# Patient Record
Sex: Male | Born: 1945 | Race: White | Hispanic: No | Marital: Married | Smoking: Former smoker
Health system: Southern US, Community
[De-identification: ages and names within clinical notes are randomized; demographics above are authoritative.]

## PROBLEM LIST (undated history)

## (undated) ENCOUNTER — Emergency Department (HOSPITAL_COMMUNITY): Payer: Medicare Other | Source: Home / Self Care

## (undated) DIAGNOSIS — J449 Chronic obstructive pulmonary disease, unspecified: Secondary | ICD-10-CM

## (undated) DIAGNOSIS — N289 Disorder of kidney and ureter, unspecified: Secondary | ICD-10-CM

## (undated) DIAGNOSIS — I2510 Atherosclerotic heart disease of native coronary artery without angina pectoris: Secondary | ICD-10-CM

## (undated) DIAGNOSIS — C679 Malignant neoplasm of bladder, unspecified: Secondary | ICD-10-CM

## (undated) DIAGNOSIS — I219 Acute myocardial infarction, unspecified: Secondary | ICD-10-CM

## (undated) HISTORY — DX: Malignant neoplasm of bladder, unspecified: C67.9

## (undated) HISTORY — PX: CORONARY STENT PLACEMENT: SHX1402

## (undated) HISTORY — DX: Atherosclerotic heart disease of native coronary artery without angina pectoris: I25.10

## (undated) HISTORY — DX: Disorder of kidney and ureter, unspecified: N28.9

## (undated) HISTORY — DX: Acute myocardial infarction, unspecified: I21.9

## (undated) HISTORY — DX: Chronic obstructive pulmonary disease, unspecified: J44.9

---

## 2008-05-31 ENCOUNTER — Encounter (INDEPENDENT_AMBULATORY_CARE_PROVIDER_SITE_OTHER): Payer: Self-pay | Admitting: Urology

## 2008-05-31 ENCOUNTER — Inpatient Hospital Stay (HOSPITAL_COMMUNITY): Admission: RE | Admit: 2008-05-31 | Discharge: 2008-06-03 | Payer: Self-pay | Admitting: Urology

## 2009-06-14 HISTORY — PX: KIDNEY SURGERY: SHX687

## 2009-08-02 LAB — PULMONARY FUNCTION TEST

## 2010-12-26 ENCOUNTER — Encounter: Payer: Self-pay | Admitting: Internal Medicine

## 2010-12-26 ENCOUNTER — Ambulatory Visit (INDEPENDENT_AMBULATORY_CARE_PROVIDER_SITE_OTHER): Payer: Medicare Other | Admitting: Internal Medicine

## 2010-12-26 VITALS — BP 128/82 | HR 92 | Ht 69.0 in | Wt 182.0 lb

## 2010-12-26 DIAGNOSIS — I2510 Atherosclerotic heart disease of native coronary artery without angina pectoris: Secondary | ICD-10-CM

## 2010-12-26 DIAGNOSIS — J449 Chronic obstructive pulmonary disease, unspecified: Secondary | ICD-10-CM

## 2010-12-26 NOTE — Patient Instructions (Addendum)
Call close hospitals, ask for the cardiopulmonary department, and ask if there is a pulmonary rehabilitation program  When the overnight oximetry result is available, please ask the DME  Company to send me a copy of the report   Try to hold your active bronchodilator meds to : Perfomist- twice daily Combivent- 2 puffs up to four times daily Albuterol by nebulizer only as an extra on bad days  Learn to recognize that trouble is coming- call early. Don't wait to see if you will get better on your own, because you might not.

## 2010-12-26 NOTE — Progress Notes (Signed)
  Subjective:    Patient ID: Gerald [name], male    DOB: October 30, 1945, 65 y.o.   MRN: [id number]  HPI 59 yoM with wife,. Former smoker followed by Dr Tanda Rockers, and coming today on referral from a hospitalist at [facility]. He was dc'd from [facility] 2 weeks ago after 3 day stay for acute exacerbation of COPD. Those summaries reviewed.  Current flare began a month ago. He denies routine cough or phlegm at baseline. DOE across parking lot. Still golfs with a cart. Long time smoker. . Sees Pulmonologist Dr Felipa Furnace in [place], but wanted to be seen here. Dx'd w/ COPD in 2010. FEV1 was 0.28%. He now feels he is back to baseline and says it is the best he has been in a year. An ONOX has been scheduled with a local DME company.  CXR 12/13/10- RLL infiltrate suspicious for atelectasis/ pneumonitis with f/u recommended. COPD, probable scarring.  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 Her has been using his inhaler very freuently and we discussed guidelines and alernatives.  Review of Systems See HPI Constitutional:   No weight loss, night sweats,  Fevers, chills, fatigue, lassitude. HEENT:   No headaches,  Difficulty swallowing,  Tooth/dental problems,  Sore throat,                No sneezing, itching, ear ache, nasal congestion, post nasal drip,   CV:  No chest pain,  Orthopnea, PND, swelling in lower extremities, anasarca, dizziness, palpitations  GI  No heartburn, indigestion, abdominal pain, nausea, vomiting, diarrhea, change in bowel habits, loss of appetite  Resp:.  No excess mucus, no productive cough,  No coughing up of blood.  No change in color of mucus.  No wheezing. Skin: no rash or lesions.  GU: no dysuria, change in color of urine, no urgency or frequency.  No flank pain.  MS:  No joint pain or swelling.  No decreased range of motion.  Psych:  No change in mood or affect. No depression or anxiety.  No memory loss.      Objective:   Physical Exam General- Alert, Oriented, Affect-appropriate, Distress- none acute. Talkative  Skin- rash-none, lesions- none, excoriation- none  Lymphadenopathy- none  Head- atraumatic  Eyes- Gross vision intact, PERRLA, conjunctivae clear, secretions  Ears- Hearing  Normal  canals, Tm L ,   R ,  Nose- Clear, No-Septal dev, mucus, polyps, erosion, perforation   Throat- Mallampati II , mucosa clear , drainage- none, tonsils- atrophic  Neck- flexible , trachea midline, no stridor , thyroid nl, carotid no bruit  Chest - symmetrical excursion , unlabored     Heart/CV- RRR , no murmur , no gallop  , no rub, nl s1 s2                     - JVD- none , edema- none, stasis changes- none, varices- none     Lung-very distant No- wheeze- none, cough- none , dullness-none, rub- none     Chest wall-  Abd- tender-no, distended-no, bowel sounds-present, HSM- no  Br/ Gen/ Rectal- Not done, not indicated  Extrem- cyanosis- none, clubbing, none, atrophy- none, strength- nl  Neuro- grossly intact to observation         Assessment & Plan:

## 2010-12-30 ENCOUNTER — Encounter: Payer: Self-pay | Admitting: Internal Medicine

## 2010-12-30 DIAGNOSIS — J441 Chronic obstructive pulmonary disease with (acute) exacerbation: Secondary | ICD-10-CM | POA: Insufficient documentation

## 2010-12-30 DIAGNOSIS — I2510 Atherosclerotic heart disease of native coronary artery without angina pectoris: Secondary | ICD-10-CM | POA: Insufficient documentation

## 2010-12-30 NOTE — Assessment & Plan Note (Addendum)
Severe COPD, mainly primary emphysema, which is why he has not felt the burden of frequent cough and phlegm.  I have asked him to reduce his metered inhaler use to no more that 2 puffs, 4 x daily.  We discussed pulmonary rehab and he will try to find a program close to his home. We discussed medication options. His long drive here is an issue and I've recommended he go back to his usual physicians.

## 2010-12-31 ENCOUNTER — Telehealth: Payer: Self-pay | Admitting: Internal Medicine

## 2010-12-31 NOTE — Telephone Encounter (Signed)
I will address this matter with [name] in the morning.

## 2011-01-05 NOTE — Telephone Encounter (Signed)
Gerald [name], was this addressed with [name]?

## 2011-01-06 NOTE — Telephone Encounter (Signed)
I have left a message for patient to call me back regarding this matter-pt had test done in [place] but I need to know where and when so I can get the results other wise the pt will need to come to our office to have PFT done so he can get in pulm rehab.

## 2011-01-06 NOTE — Telephone Encounter (Signed)
I spoke with patient-he states that he had his PFT at [facility] in [place] approx in 2010; I called and spoke with Margaretha Glassing in Medical records and had to fax letter head request to her; once she gets this she will fax them to me to let [name] review and send order and fax results to [facility] pulm rehab.

## 2011-01-07 ENCOUNTER — Other Ambulatory Visit: Payer: Self-pay | Admitting: Internal Medicine

## 2011-01-07 ENCOUNTER — Encounter: Payer: Self-pay | Admitting: Internal Medicine

## 2011-01-07 DIAGNOSIS — J449 Chronic obstructive pulmonary disease, unspecified: Secondary | ICD-10-CM

## 2011-01-07 NOTE — Telephone Encounter (Signed)
Bjorn Loser has completed this order and Pulm Rehab will contact patient.

## 2011-01-07 NOTE — Telephone Encounter (Signed)
PFT results were faxed and placed on CDYs cart to review and agree to send to [facility].

## 2011-01-15 ENCOUNTER — Encounter (HOSPITAL_COMMUNITY)
Admission: RE | Admit: 2011-01-15 | Discharge: 2011-01-15 | Disposition: A | Discharge status: Home / Self Care | Payer: Medicare Other | Source: Ambulatory Visit | Attending: Internal Medicine | Admitting: Internal Medicine

## 2011-01-15 DIAGNOSIS — J4489 Other specified chronic obstructive pulmonary disease: Secondary | ICD-10-CM | POA: Insufficient documentation

## 2011-01-15 DIAGNOSIS — J449 Chronic obstructive pulmonary disease, unspecified: Secondary | ICD-10-CM | POA: Insufficient documentation

## 2011-01-15 DIAGNOSIS — Z5189 Encounter for other specified aftercare: Secondary | ICD-10-CM | POA: Insufficient documentation

## 2011-01-19 ENCOUNTER — Encounter (HOSPITAL_COMMUNITY): Payer: Medicare Other

## 2011-01-19 ENCOUNTER — Encounter: Payer: Self-pay | Admitting: Internal Medicine

## 2011-01-21 ENCOUNTER — Encounter (HOSPITAL_COMMUNITY): Payer: Medicare Other

## 2011-01-26 ENCOUNTER — Encounter (HOSPITAL_COMMUNITY): Payer: Medicare Other

## 2011-01-27 NOTE — Discharge Summary (Signed)
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   HISTORY AND PHYSICAL:  For full details, please see admission history  and physical.  Briefly, Gerald Gerald [name] is a 65 year old gentleman who  initially presented with gross hematuria and underwent an evaluation by  Dr. Ian Bushman in [place], [place].  He was subsequently diagnosed with  a renal pelvic tumor of the right kidney.  His brushing and cytology was  consistent with a urothelial carcinoma and did appear to be low-grade.  After discussion regarding management options for treatment, he elected  to proceed with a right laparoscopic nephroureterectomy.  He did undergo  a metastatic evaluation which was negative.   HOSPITAL COURSE:  On May 31, 2008, the patient was taken to the  operating room and underwent a right laparoscopic nephroureterectomy.  He tolerated this procedure well without complications.  Postoperatively, he was able to be transferred to a regular hospital  room following recovery from anesthesia.  He was able to begin  ambulating the night of surgery and remained hemodynamically stable.  He  was kept on telemetry due to his history of coronary artery disease.  On  postoperative day #1, his hematocrit remained stable at 39.8.  In  addition, his renal function remained stable with a creatinine of 1.2.  He did begin passing flatus on postoperative day #1, and his diet was  gradually advanced throughout the day until he was able tolerate a  regular diet on the  afternoon of postoperative day #1.  In addition, he  maintained excellent urine output with minimal output from his pelvic  drain.  His pelvic drain fluid was sent for a creatinine level and  returned at 1.3, consistent with his serum creatinine.  Therefore, his  drain was able to be removed on postoperative day #2.  On postoperative  day #2, he was reevaluated and was having increased abdominal pain and  decreased appetite.  He was therefore kept for observation and his  appetite improved throughout the afternoon of postoperative day #2.  He  was administered a suppository and did pass more flatus and had relief  of his abdominal discomfort.  On the morning of postoperative day #3, he  was reevaluated and was much improved.  He was therefore felt stable for  discharge home with his Foley catheter.   DISPOSITION:  Home.   DISCHARGE MEDICATIONS:  He was instructed to resume his regular home  medications, except any aspirin, nonsteroidal anti-inflammatory drugs,  or herbal supplements.  He was given a prescription to take Vicodin as  needed for pain and to use Colace as a  stool softener.  He was also  given a prescription to begin Cipro 1 day prior to his return visit for  Foley catheter removal next week at which time he will undergo a  cystogram.   DISCHARGE INSTRUCTIONS:  He was instructed to be ambulatory, but  specifically told to refrain from any heavy lifting, strenuous activity,  or driving.  He was instructed on routine Foley catheter care.   FOLLOW UP:  He will follow up next week for a cystogram and to discuss  his surgical pathology results which are pending at the time of his  discharge.      Heloise Purpura, MD  Electronically Signed     LB/MEDQ  D:  06/03/2008  T:  06/05/2008  Job:  [id number]

## 2011-01-27 NOTE — Op Note (Signed)
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   PROCEDURE:  Right laparoscopic nephroureterectomy.   SURGEON:  Heloise Purpura, MD   ASSISTANT:  Delia Chimes, NP and Dr. Delman Kitten.   ANESTHESIA:  General.   COMPLICATIONS:  None.   ESTIMATED BLOOD LOSS:  100 mL.   INTRAVENOUS FLUIDS:  2950 of crystalloid.   SPECIMENS:  Right kidney, ureter and bladder cuff.   DISPOSITION OF SPECIMENS:  To pathology.   DRAINS:  Number 15 Jackson-Pratt drain.   INDICATIONS:  Mr. Dineen is a 65 year old gentleman who presented with  gross hematuria and was subsequently found to have a filling defect in  his right renal pelvis and did undergo a brushing and cytology  consistent with a low-grade papillary urothelial carcinoma.  He  underwent a metastatic evaluation which was negative and the remainder  of his urinary tract appeared to be free of any tumors.  After a  discussion regarding management options for treatment, he elected to  proceed with surgical therapy and the above procedure.  Potential risks,  complications, and alternative treatment options associated with this  procedure were discussed with the patient in detail and informed consent  was obtained.   DESCRIPTION OF PROCEDURE:  The patient was taken to the operating room  and a general anesthetic was administered.  He was given preoperative  antibiotics, placed in the right modified flank position and prepped and  draped in the usual sterile fashion.  Next a preoperative time-out was  performed.  A site was selected just superior to the umbilicus for  placement of the camera port.  This was placed using a standard open  Hasson technique which allowed entry into the peritoneal cavity under  direct vision and without difficulty.  Zero Vicryl holding stitches were  placed in the rectus fascia and a 10 mm Hasson cannula was placed and  secured with these stitches.  The pneumoperitoneum was established and a  30 degree lens was used to inspect the abdomen.  The patient was noted  to have a very dilated colon but otherwise no evidence of any intra-  abdominal injuries or other abnormalities. Additional ports were then  placed under direct vision including a 5 mm port in the right upper  quadrant and laterally and a 12 mm port in the right lower quadrant.  The white line of Toldt was incised along the length of the descending  colon allowing the colon to be mobilized medially and thereby exposing  the retroperitoneum and kidney.  The space between the mesocolon and  anterior layer of Gerota's fascia was developed.  The  ureter was  identified and lifted anteriorly off the psoas muscle.  Dissection then  proceeded superiorly allowing identification of the renal hilar vessels.  A solitary renal artery and vein were identified and were isolated from  the surrounding tissues with the aid of the Harmonic scalpel.  The renal  artery was isolated with right-angle dissection and divided between  multiple 10 mm Hem-o-lok clips.  The renal vein was then isolated and  similarly divided after ligation with four separate 15 mm Hem-o-lok  clips.  Gerota's fascia was entered intentionally superiorly, thereby  sparing the adrenal gland and the attachments between the kidney and the  liver were carefully taken down with the Harmonic scalpel along with the  attachments at the lateral sidewall.  The ureter was then traced  inferiorly down the psoas muscle.  The gonadal vein was identified where   it crossed the renal vein and was divided between multiple Hem-o-lok  clips.  The ureter was then isolated down to a point past the common  iliac artery.  At this point, the table was placed into the  Trendelenburg position and an additional 12 mm port was placed in the  lower midline of the abdomen.  The ureter was then traced down to the  bladder laparoscopically.  The superior vesical artery was identified  and was divided between multiple Hem-o-lok clips.  Once the ureter was  freed all the way down to the bladder the bladder was filled and the  bladder was reflected posteriorly allowing entry into space of Retzius  and thereby exposing the right lateral aspect of the bladder down to the  ureter.  At this point the 12 mm port that had been initially placed in  the right lower quadrant and the camera port were closed with zero  Vicryl sutures placed with the aid of a suture passer device.  The  remaining ports were then removed under direct vision.  The lower  midline port site was then extended in the lower midline to allow  removal of the kidney.  A self-retaining Bookwalter retractor was placed  and the ureter was freed up with a cuff of bladder down to the detrusor  muscle.  Once adequate bladder cuff had been developed this was clamped  with a right-angle clamp.  A 3-0 Vicryl pursestring suture was placed  widely around this area and the ureter was clamped and divided distally  and sent off for permanent pathologic analysis.  The clamp portion of  the bladder was then inverted and oversewn with a running 3-0 Vicryl  suture.  The previously placed 3-0 Vicryl pursestring was then closed  down for an additional two-layer closure.  The bladder was then filled  with sterile saline and there was no evidence of a leak.  Hemostasis  appeared excellent and irrigation of the pelvis was performed.  The  fascial opening in the lower midline was then closed with two running #1  PDS sutures  after a #15 Jackson-Pratt drain was placed in the pelvis.  It was secured to the skin with a nylon suture.  All ports and incision  sites were then injected with quarter percent Marcaine and  reapproximated at the skin level with staples.  Sterile dressings were  applied.  The patient appeared to tolerate the procedure well without  complications.  He was able to be extubated and transferred to the  recovery unit in satisfactory condition.      Heloise Purpura, MD  Electronically Signed  LB/MEDQ  D:  05/31/2008  T:  06/03/2008  Job:  [id number]

## 2011-01-28 ENCOUNTER — Encounter (HOSPITAL_COMMUNITY): Payer: Medicare Other

## 2011-02-02 ENCOUNTER — Encounter (HOSPITAL_COMMUNITY): Payer: Medicare Other

## 2011-02-04 ENCOUNTER — Encounter (HOSPITAL_COMMUNITY): Payer: Medicare Other

## 2011-02-05 ENCOUNTER — Ambulatory Visit (INDEPENDENT_AMBULATORY_CARE_PROVIDER_SITE_OTHER): Payer: Medicare Other | Admitting: Internal Medicine

## 2011-02-05 ENCOUNTER — Encounter: Payer: Self-pay | Admitting: Internal Medicine

## 2011-02-05 ENCOUNTER — Telehealth: Payer: Self-pay | Admitting: Internal Medicine

## 2011-02-05 VITALS — BP 122/70 | HR 72 | Ht 69.0 in | Wt 181.2 lb

## 2011-02-05 DIAGNOSIS — J449 Chronic obstructive pulmonary disease, unspecified: Secondary | ICD-10-CM

## 2011-02-05 DIAGNOSIS — I2510 Atherosclerotic heart disease of native coronary artery without angina pectoris: Secondary | ICD-10-CM

## 2011-02-05 NOTE — Telephone Encounter (Signed)
Patient called back wanted to make sure Katie had the message to fax his Pulmonary Rehab results to him.Vedia Coffer

## 2011-02-05 NOTE — Telephone Encounter (Signed)
Will forward this to [facility]

## 2011-02-05 NOTE — Progress Notes (Signed)
Subjective:    Patient ID: Gerald [name], male    DOB: [birth year], 65 y.o.   MRN: [id number]  HPI    Review of Systems     Objective:   Physical Exam        Assessment & Plan:   Subjective:    Patient ID: Gerald [name], male    DOB: 03/07/1946, 66 y.o.   MRN: [id number]  HPI 12/26/10- 57 yoM with wife,. Former smoker followed by Dr Tanda Rockers, and coming today on referral from a hospitalist at [facility]. He was dc'd from [facility] 2 weeks ago after 3 day stay for acute exacerbation of COPD. Those summaries reviewed.  Current flare began a month ago. He denies routine cough or phlegm at baseline. DOE across parking lot. Still golfs with a cart. Long time smoker. . Sees Pulmonologist Dr Felipa Furnace in [place], but wanted to be seen here. Dx'd w/ COPD in 2010. FEV1 was 0.28%. He now feels he is back to baseline and says it is the best he has been in a year. An ONOX has been scheduled with a local DME company.  CXR 12/13/10- RLL infiltrate suspicious for atelectasis/ pneumonitis with f/u recommended. COPD, probable scarring.  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 Her has been using his inhaler very freuently and we discussed guidelines and alernatives.  02/05/11- COPD, CAD, wife here Since last here has started pulmonary rehab at [facility] and he feels it is helping. Noticing trace ankle edema in recent days.  Feeling a little more bloated and BP goes up with exercise. Continues sleeping with O2 at 2l [facility]. Nebulizer helps clear morning mucus.    Review of Systems See HPI Constitutional:   No weight loss, night sweats,  Fevers, chills, fatigue, lassitude. HEENT:   No headaches,  Difficulty swallowing,  Tooth/dental problems,  Sore throat,                No sneezing, itching, ear ache, nasal congestion, post nasal drip,   CV:  No chest pain,  Orthopnea, PND, , anasarca, dizziness, palpitations  GI  No heartburn,  indigestion, abdominal pain, nausea, vomiting, diarrhea, change in bowel habits, loss of appetite  Resp:.  No excess mucus, no productive cough,  No coughing up of blood.  No change in color of mucus.  No wheezing. Skin: no rash or lesions.  GU: no dysuria, change in color of urine, no urgency or frequency.  No flank pain.  MS:  No joint pain or swelling.  No decreased range of motion.  Psych:  No change in mood or affect. No depression or anxiety.  No memory loss.      Objective:   Physical Exam General- Alert, Oriented, Affect-appropriate, Distress- none acute. Talkative  Skin- rash-none, lesions- none, excoriation- none  Lymphadenopathy- none  Head- atraumatic  Eyes- Gross vision intact, PERRLA, conjunctivae clear secretions  Ears- Hearing  Normal  canals, Tm-  normal,  Nose- Clear, No-Septal dev, mucus, polyps, erosion, perforation   Throat- Mallampati II , mucosa clear , drainage- none, tonsils- atrophic  Neck- flexible , trachea midline, no stridor , thyroid nl, carotid no bruit  Chest - symmetrical excursion , unlabored     Heart/CV- RRR , no murmur , no gallop  , no rub, nl s1 s2                     - JVD- none , edema-trace, stasis changes-  none, varices- none     Lung-very distant        wheeze- expiratory,             cough- none , dullness-none, rub- none     Chest wall-  Abd- tender-no, distended-no, bowel sounds-present, HSM- no  Br/ Gen/ Rectal- Not done, not indicated  Extrem- cyanosis- none, clubbing, none, atrophy- none, strength- nl  Neuro- grossly intact to observation         Assessment & Plan:

## 2011-02-05 NOTE — Patient Instructions (Signed)
We discussed travel needs- it never hurts to discuss this with airlines, cruise ship companies and hotels ahead of time.

## 2011-02-05 NOTE — Assessment & Plan Note (Signed)
Pulmonary rehab is helping  We discussed how to pace himself No need to change meds.

## 2011-02-09 ENCOUNTER — Encounter (HOSPITAL_COMMUNITY): Payer: Medicare Other

## 2011-02-09 NOTE — Assessment & Plan Note (Signed)
Discussed overlap of cardiac and pulmonary dyspnea considerations.

## 2011-02-11 ENCOUNTER — Encounter (HOSPITAL_COMMUNITY): Payer: Medicare Other

## 2011-02-13 ENCOUNTER — Encounter: Payer: Self-pay | Admitting: Internal Medicine

## 2011-02-16 ENCOUNTER — Encounter (HOSPITAL_COMMUNITY)
Admission: RE | Admit: 2011-02-16 | Discharge: 2011-02-16 | Disposition: A | Discharge status: Home / Self Care | Payer: Medicare Other | Source: Ambulatory Visit | Attending: Internal Medicine | Admitting: Internal Medicine

## 2011-02-16 DIAGNOSIS — J449 Chronic obstructive pulmonary disease, unspecified: Secondary | ICD-10-CM | POA: Insufficient documentation

## 2011-02-16 DIAGNOSIS — Z5189 Encounter for other specified aftercare: Secondary | ICD-10-CM | POA: Insufficient documentation

## 2011-02-16 DIAGNOSIS — J4489 Other specified chronic obstructive pulmonary disease: Secondary | ICD-10-CM | POA: Insufficient documentation

## 2011-02-18 ENCOUNTER — Encounter (HOSPITAL_COMMUNITY): Payer: Medicare Other

## 2011-02-23 ENCOUNTER — Encounter (HOSPITAL_COMMUNITY): Payer: Medicare Other

## 2011-02-25 ENCOUNTER — Encounter (HOSPITAL_COMMUNITY): Payer: Medicare Other

## 2011-03-02 ENCOUNTER — Encounter (HOSPITAL_COMMUNITY): Payer: Medicare Other

## 2011-03-04 ENCOUNTER — Encounter (HOSPITAL_COMMUNITY): Payer: Medicare Other

## 2011-03-09 ENCOUNTER — Encounter (HOSPITAL_COMMUNITY): Admission: RE | Admit: 2011-03-09 | Payer: Medicare Other | Source: Ambulatory Visit

## 2011-03-11 ENCOUNTER — Encounter (HOSPITAL_COMMUNITY): Admission: RE | Admit: 2011-03-11 | Payer: Medicare Other | Source: Ambulatory Visit

## 2011-03-16 ENCOUNTER — Encounter (HOSPITAL_COMMUNITY): Payer: Medicare Other

## 2011-03-16 DIAGNOSIS — J449 Chronic obstructive pulmonary disease, unspecified: Secondary | ICD-10-CM | POA: Insufficient documentation

## 2011-03-16 DIAGNOSIS — J4489 Other specified chronic obstructive pulmonary disease: Secondary | ICD-10-CM | POA: Insufficient documentation

## 2011-03-16 DIAGNOSIS — Z5189 Encounter for other specified aftercare: Secondary | ICD-10-CM | POA: Insufficient documentation

## 2011-03-18 ENCOUNTER — Encounter (HOSPITAL_COMMUNITY): Payer: Medicare Other

## 2011-03-23 ENCOUNTER — Encounter (HOSPITAL_COMMUNITY): Payer: Medicare Other

## 2011-03-25 ENCOUNTER — Encounter (HOSPITAL_COMMUNITY): Payer: Medicare Other

## 2011-03-30 ENCOUNTER — Encounter (HOSPITAL_COMMUNITY)
Admission: RE | Admit: 2011-03-30 | Discharge: 2011-03-30 | Disposition: A | Discharge status: Home / Self Care | Payer: Medicare Other | Source: Ambulatory Visit | Attending: Internal Medicine | Admitting: Internal Medicine

## 2011-04-01 ENCOUNTER — Encounter (HOSPITAL_COMMUNITY)
Admission: RE | Admit: 2011-04-01 | Discharge: 2011-04-01 | Disposition: A | Discharge status: Home / Self Care | Payer: Medicare Other | Source: Ambulatory Visit | Attending: Internal Medicine | Admitting: Internal Medicine

## 2011-04-06 ENCOUNTER — Encounter (HOSPITAL_COMMUNITY)
Admission: RE | Admit: 2011-04-06 | Discharge: 2011-04-06 | Disposition: A | Discharge status: Home / Self Care | Payer: Medicare Other | Source: Ambulatory Visit | Attending: Internal Medicine | Admitting: Internal Medicine

## 2011-04-08 ENCOUNTER — Encounter (HOSPITAL_COMMUNITY)
Admission: RE | Admit: 2011-04-08 | Discharge: 2011-04-08 | Disposition: A | Discharge status: Home / Self Care | Payer: Medicare Other | Source: Ambulatory Visit | Attending: Internal Medicine | Admitting: Internal Medicine

## 2011-04-13 ENCOUNTER — Encounter (HOSPITAL_COMMUNITY)
Admission: RE | Admit: 2011-04-13 | Discharge: 2011-04-13 | Disposition: A | Discharge status: Home / Self Care | Payer: Medicare Other | Source: Ambulatory Visit | Attending: Internal Medicine | Admitting: Internal Medicine

## 2011-04-15 ENCOUNTER — Encounter (HOSPITAL_COMMUNITY)
Admission: RE | Admit: 2011-04-15 | Discharge: 2011-04-15 | Disposition: A | Discharge status: Home / Self Care | Payer: Medicare Other | Source: Ambulatory Visit | Attending: Internal Medicine | Admitting: Internal Medicine

## 2011-04-15 DIAGNOSIS — Z5189 Encounter for other specified aftercare: Secondary | ICD-10-CM | POA: Insufficient documentation

## 2011-04-15 DIAGNOSIS — J4489 Other specified chronic obstructive pulmonary disease: Secondary | ICD-10-CM | POA: Insufficient documentation

## 2011-04-15 DIAGNOSIS — J449 Chronic obstructive pulmonary disease, unspecified: Secondary | ICD-10-CM | POA: Insufficient documentation

## 2011-04-20 ENCOUNTER — Encounter (HOSPITAL_COMMUNITY): Payer: Medicare Other

## 2011-04-22 ENCOUNTER — Encounter (HOSPITAL_COMMUNITY)
Admission: RE | Admit: 2011-04-22 | Discharge: 2011-04-22 | Disposition: A | Discharge status: Home / Self Care | Payer: Medicare Other | Source: Ambulatory Visit | Attending: Internal Medicine | Admitting: Internal Medicine

## 2011-04-27 ENCOUNTER — Encounter (HOSPITAL_COMMUNITY)
Admission: RE | Admit: 2011-04-27 | Discharge: 2011-04-27 | Disposition: A | Discharge status: Home / Self Care | Payer: Medicare Other | Source: Ambulatory Visit | Attending: Internal Medicine | Admitting: Internal Medicine

## 2011-04-29 ENCOUNTER — Encounter (HOSPITAL_COMMUNITY)
Admission: RE | Admit: 2011-04-29 | Discharge: 2011-04-29 | Disposition: A | Discharge status: Home / Self Care | Payer: Medicare Other | Source: Ambulatory Visit | Attending: Internal Medicine | Admitting: Internal Medicine

## 2011-05-04 ENCOUNTER — Encounter (HOSPITAL_COMMUNITY): Payer: Medicare Other

## 2011-05-04 NOTE — Progress Notes (Signed)
Cardiac Rehab Progress Report  Orientation:01/25/11 Graduate Date:  tbd Discharge Date:  tbd  Pulmonologist:Clinton Young Family MD:  Dr. Tanda Rockers Class Time:  13:00  A.  Exercise Program:  Tolerates exercise @ 2.1 METS for 15 minutes  B.  Mental Health:  Good mental attitude  C.  Education/Instruction/Skills  Knows THR for exercise, Uses Perceived Exertion Scale and/or Dyspnea Scale and Attended all education classes  D.  Nutrition/Weight Control/Body Composition:  Adherence to prescribed nutrition program: good   E.  Blood Lipids    No results found for this basename: CHOL     No results found for this basename: TRIG     No results found for this basename: HDL     No results found for this basename: CHOLHDL     No results found for this basename: LDLDIRECT      F.  Lifestyle Changes:  Making positive lifestyle changes  G.  Symptoms noted with exercise:  Asymptomatic  Report Completed By:  Angelica Pou  Comments:  This is patient's 1'st week report  He did very well for his 3 day . He achieved a peak met's of 2.1, and continues to improve

## 2011-05-06 ENCOUNTER — Encounter (HOSPITAL_COMMUNITY): Payer: Medicare Other

## 2011-05-11 ENCOUNTER — Encounter (HOSPITAL_COMMUNITY): Payer: Medicare Other

## 2011-05-13 ENCOUNTER — Encounter (HOSPITAL_COMMUNITY): Payer: Medicare Other

## 2011-05-18 ENCOUNTER — Encounter (HOSPITAL_COMMUNITY): Payer: Medicare Other

## 2011-05-20 ENCOUNTER — Encounter (HOSPITAL_COMMUNITY): Payer: Medicare Other

## 2011-05-25 ENCOUNTER — Encounter (HOSPITAL_COMMUNITY): Payer: Medicare Other

## 2011-05-27 ENCOUNTER — Encounter (HOSPITAL_COMMUNITY): Payer: Medicare Other

## 2011-05-29 NOTE — Progress Notes (Signed)
Cardiac Rehab Progress Report  Orientation: 01/14/2009 Graduate Date: tbd Discharge Date:  tbd Pulmonologist: Jetty Duhamel Family MD:  Dr. Tanda Rockers Class Time:  13:00  A.  Exercise Program:  Tolerates exercise @ 2.2 METS for 15 minutes  B.  Mental Health:  Good mental attitude  C.  Education/Instruction/Skills  Knows THR for exercise  D.  Nutrition/Weight Control/Body Composition:  Adherence to prescribed nutrition program: good   E.  Blood Lipids    No results found for this basename: CHOL     No results found for this basename: TRIG     No results found for this basename: HDL     No results found for this basename: CHOLHDL     No results found for this basename: LDLDIRECT      F.  Lifestyle Changes:  Making positive lifestyle changes  G.  Symptoms noted with exercise:  Asymptomatic  Report Completed By:  Angelica Pou  Comments:  Patient is doing well in pulmonary rehab He has achieved a peak mets of 2.2 and has a peak O2 of 93 He has reached his half way  point

## 2011-05-29 NOTE — Progress Notes (Signed)
Cardiac Rehab Progress Report  Orientation:  01/15/2011 Graduate Date:  04/29/2011 Discharge Date:  04/29/2011  Pulmonologist; Joni Fears young Family MD:  Dr. Tanda Rockers Class Time:  13:00  A.  Exercise Program:  Tolerates exercise @2 .3 METS for 15 minutes and Discharged to home exercise program.  Anticipated compliance:  excellent  B.  Mental Health:  Good mental attitude  C.  Education/Instruction/Skills  Knows THR for exercise, Uses Perceived Exertion Scale and/or Dyspnea Scale and Attended all education classes  D.  Nutrition/Weight Control/Body Composition:  Adherence to prescribed nutrition program: good   E.  Blood Lipids    No results found for this basename: CHOL     No results found for this basename: TRIG     No results found for this basename: HDL     No results found for this basename: CHOLHDL     No results found for this basename: LDLDIRECT      F.  Lifestyle Changes:  Making positive lifestyle changes  G.  Symptoms noted with exercise:  Asymptomatic  Report Completed By:  Angelica Pou  Comments: Patient is discharged today after 24 sessions he achieved a peak mets of 2.3 and a peak O2 of 96. He is very motivated to continue to exercise. Cardiac rehab staff will continue to monitor his progress at 1 month, 6 months,and 1 year to assure compliance.

## 2011-06-01 ENCOUNTER — Encounter (HOSPITAL_COMMUNITY): Payer: Medicare Other

## 2011-06-03 ENCOUNTER — Encounter (HOSPITAL_COMMUNITY): Payer: Medicare Other

## 2011-06-08 ENCOUNTER — Encounter (HOSPITAL_COMMUNITY): Payer: Medicare Other

## 2011-06-10 ENCOUNTER — Encounter (HOSPITAL_COMMUNITY): Payer: Medicare Other

## 2011-06-11 ENCOUNTER — Ambulatory Visit: Payer: Medicare Other | Admitting: Internal Medicine

## 2011-06-12 ENCOUNTER — Telehealth: Payer: Self-pay | Admitting: Internal Medicine

## 2011-06-12 NOTE — Telephone Encounter (Signed)
Please note that pt had originally been scheduled to see dr young on 9/27 but was "bumped" as dr young changed his schedule. Gerald [name]

## 2011-06-12 NOTE — Telephone Encounter (Signed)
Spoke with spouse and advised he has no openings that am and should keep appt at 3:30. She verbalized understanding and will keep the appt

## 2011-06-15 ENCOUNTER — Encounter: Payer: Self-pay | Admitting: Internal Medicine

## 2011-06-15 ENCOUNTER — Ambulatory Visit (INDEPENDENT_AMBULATORY_CARE_PROVIDER_SITE_OTHER): Payer: Medicare Other | Admitting: Internal Medicine

## 2011-06-15 VITALS — BP 108/78 | HR 95 | Ht 69.0 in | Wt 180.8 lb

## 2011-06-15 DIAGNOSIS — J449 Chronic obstructive pulmonary disease, unspecified: Secondary | ICD-10-CM

## 2011-06-15 DIAGNOSIS — Z23 Encounter for immunization: Secondary | ICD-10-CM

## 2011-06-15 LAB — BASIC METABOLIC PANEL
BUN: 12
CO2: 28
CO2: 30
Calcium: 8.1 — ABNORMAL LOW
Chloride: 101
Chloride: 99
GFR calc Af Amer: >60
GFR calc non Af Amer: >60
Glucose, Bld: 153 — ABNORMAL HIGH
Glucose, Bld: 183 — ABNORMAL HIGH
Potassium: 4.1
Potassium: 4.6
Sodium: 132 — ABNORMAL LOW
Sodium: 138

## 2011-06-15 LAB — CREATININE, FLUID (PLEURAL, PERITONEAL, JP DRAINAGE): Creat, Fluid: 1.3

## 2011-06-15 LAB — TYPE AND SCREEN

## 2011-06-15 LAB — HEMOGLOBIN AND HEMATOCRIT, BLOOD: HCT: 39.8

## 2011-06-15 MED ORDER — AZITHROMYCIN 250 MG PO TABS: ORAL_TABLET | ORAL | Status: AC

## 2011-06-15 MED ORDER — METHYLPREDNISOLONE ACETATE 80 MG/ML IJ SUSP
80.0000 mg | Freq: Once | INTRAMUSCULAR | Status: AC
  Administered 2011-06-15: 80 mg via INTRAMUSCULAR

## 2011-06-15 MED ORDER — ROFLUMILAST 500 MCG PO TABS
500.0000 ug | ORAL_TABLET | Freq: Every day | ORAL | Status: DC
Start: 2011-06-15 — End: 2011-10-02

## 2011-06-15 NOTE — Patient Instructions (Signed)
Depo 80  Flu shot  Stop Spiriva- ok to use on a bad day for shortness of breath  Script for Daliresp - start with 1/2 pill every day or every other day, after a meal, for a week or two. If GI is ok, then try taking a whole tab daily.

## 2011-06-15 NOTE — Progress Notes (Signed)
Patient ID: Gerald [name], male    DOB: 02-Nov-1945, 65 y.o.   MRN: [id number]  HPI 12/26/10- 66 yoM with wife,. Former smoker followed by Dr Tanda Rockers, and coming today on referral from a hospitalist at [facility]. He was dc'd from [place] 2 weeks ago after 3 day stay for acute exacerbation of COPD. Those summaries reviewed.  Current flare began a month ago. He denies routine cough or phlegm at baseline. DOE across parking lot. Still golfs with a cart. Long time smoker. . Sees Pulmonologist Dr Felipa Furnace in [place], but wanted to be seen here. Dx'd w/ COPD in 2010. FEV1 was 0.28%. He now feels he is back to baseline and says it is the best he has been in a year. An ONOX has been scheduled with a local DME company.  CXR 12/13/10- RLL infiltrate suspicious for atelectasis/ pneumonitis with f/u recommended. COPD, probable scarring.  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 Her has been using his inhaler very freuently and we discussed guidelines and alernatives.  02/05/11- COPD, CAD, wife here Since last here has started pulmonary rehab at [facility] and he feels it is helping. Noticing trace ankle edema in recent days.  Feeling a little more bloated and BP goes up with exercise. Continues sleeping with O2 at 2 L/ [facility]. Nebulizer helps clear morning mucus.   06/15/11- 65 year old male former smoker followed for COPD, CAD, .............[name] wife here He completed pulmonary rehabilitation program at [facility] and says it really didn't help him. He is trying to walk more on his own but has not joined any formal program for exercise. He went to the emergency room September 13 with a COPD exacerbation but denies obvious cold or infection at that time. Chest x-ray there was stable with no new process. He was treated with a steroid shot of antibiotic. Comments on some increase in weight and abdominal girth. Denies palpitation tachycardia or ankle  edema. He often uses his Combivent inhaler several times a day. Using his nebulizer machine with Proformist and budesonide twice every day. Sleeps on oxygen at 2 L.  He does gradually got more congested with increasing phlegm. Is now coughing again over the last few days with throat tickle. We discussed and are going to try again with Daliresp as a nonsteroidal anti-inflammatory.  Review of Systems See HPI Constitutional:   No weight loss, night sweats,  Fevers, chills, fatigue, lassitude. HEENT:   No headaches,  Difficulty swallowing,  Tooth/dental problems,  Sore throat,                No sneezing, itching, ear ache, nasal congestion, post nasal drip,  CV:  No chest pain,  Orthopnea, PND, , anasarca, dizziness, palpitations GI  No heartburn, indigestion, abdominal pain, nausea, vomiting, diarrhea, change in bowel habits, loss of appetite Resp:.  No excess mucus, no productive cough,  No coughing up of blood.  No change in color of mucus.  No wheezing. Skin: no rash or lesions. GU: no dysuria, change in color of urine, no urgency or frequency.  No flank pain. MS:  No joint pain or swelling.  No decreased range of motion.  Psych:  No change in mood or affect. No depression or anxiety.  No memory loss.      Objective:   Physical Exam General- Alert, Oriented, Affect-appropriate, Distress- none acute Skin- rash-none, lesions- none, excoriation- none Lymphadenopathy- none Head- atraumatic  Eyes- Gross vision intact, PERRLA, conjunctivae clear secretions            Ears- Hearing, canals-normal            Nose- Clear, no-Septal dev, mucus, polyps, erosion, perforation             Throat- Mallampati II , mucosa clear , drainage- none, tonsils- atrophic Neck- flexible , trachea midline, no stridor , thyroid nl, carotid no bruit Chest - symmetrical excursion , unlabored           Heart/CV- RRR , no murmur , no gallop  , no rub, nl s1 s2                           - JVD- none , edema-  none, stasis changes- none, varices- none           Lung- distant without extra sounds., wheeze- none, cough- none , dullness-none, rub- none           Chest wall-  Abd- tender-no, distended-no, bowel sounds-present, HSM- no Br/ Gen/ Rectal- Not done, not indicated Extrem- cyanosis- none, clubbing, none, atrophy- none, strength- nl Neuro- grossly intact to observation

## 2011-06-16 NOTE — Assessment & Plan Note (Addendum)
Severe COPD after a 50-pack-year smoking history. I do think if we can keep him active he can prolong his life. We have discussed medication choices. Discussed flu shot Discussed trying Daliresp again.

## 2011-06-17 LAB — BASIC METABOLIC PANEL
Calcium: 9
GFR calc Af Amer: >60
GFR calc non Af Amer: >60
Glucose, Bld: 196 — ABNORMAL HIGH
Potassium: 4.4
Sodium: 142

## 2011-06-17 LAB — CBC
HCT: 45.1
Hemoglobin: 15.3
RDW: 14.7
WBC: 5.5

## 2011-06-30 ENCOUNTER — Other Ambulatory Visit: Payer: Self-pay | Admitting: *Deleted

## 2011-06-30 MED ORDER — BUDESONIDE 0.5 MG/2ML IN SUSP: 0.5000 mg | Freq: Two times a day (BID) | RESPIRATORY_TRACT | Status: DC

## 2011-06-30 MED ORDER — IPRATROPIUM-ALBUTEROL 0.5-2.5 (3) MG/3ML IN SOLN: 3.0000 mL | RESPIRATORY_TRACT | Status: DC | PRN

## 2011-06-30 MED ORDER — FORMOTEROL FUMARATE 20 MCG/2ML IN NEBU: 20.0000 ug | INHALATION_SOLUTION | Freq: Two times a day (BID) | RESPIRATORY_TRACT | Status: DC

## 2011-08-14 ENCOUNTER — Telehealth: Payer: Self-pay | Admitting: Internal Medicine

## 2011-08-14 NOTE — Telephone Encounter (Signed)
Pt had questions about cough syrup he was given for sickness- concerned about taking cough syrup when he wanted to cough stuff up- advised the MD gave syrup for benefits to help with cough and should take as directed.

## 2011-09-25 ENCOUNTER — Telehealth: Payer: Self-pay | Admitting: Internal Medicine

## 2011-09-25 MED ORDER — IPRATROPIUM-ALBUTEROL 18-103 MCG/ACT IN AERO: INHALATION_SPRAY | RESPIRATORY_TRACT | Status: DC

## 2011-09-25 NOTE — Telephone Encounter (Signed)
RX for combivent has been sent and pt spouse is aware. Nothing further was needed

## 2011-10-01 ENCOUNTER — Telehealth: Payer: Self-pay | Admitting: Allergy

## 2011-10-01 NOTE — Telephone Encounter (Signed)
Per CY-okay to refill as requested. 

## 2011-10-01 NOTE — Telephone Encounter (Signed)
[facility] requesting rx for Daliresp tab 500 mg # 90 X3 Dr young is this ok to fill ?

## 2011-10-02 MED ORDER — ROFLUMILAST 500 MCG PO TABS: 500.0000 ug | ORAL_TABLET | Freq: Every day | ORAL | Status: DC

## 2011-10-02 NOTE — Telephone Encounter (Signed)
RX has been sent as requested

## 2011-10-21 ENCOUNTER — Encounter: Payer: Self-pay | Admitting: Internal Medicine

## 2011-10-21 ENCOUNTER — Ambulatory Visit (INDEPENDENT_AMBULATORY_CARE_PROVIDER_SITE_OTHER): Payer: Medicare Other | Admitting: Internal Medicine

## 2011-10-21 VITALS — BP 122/66 | HR 87 | Ht 69.0 in | Wt 171.6 lb

## 2011-10-21 DIAGNOSIS — J449 Chronic obstructive pulmonary disease, unspecified: Secondary | ICD-10-CM

## 2011-10-21 MED ORDER — AMOXICILLIN 500 MG PO CAPS: 500.0000 mg | ORAL_CAPSULE | Freq: Two times a day (BID) | ORAL | Status: AC

## 2011-10-21 MED ORDER — LEVALBUTEROL HCL 0.63 MG/3ML IN NEBU
0.6300 mg | INHALATION_SOLUTION | Freq: Once | RESPIRATORY_TRACT | Status: AC
  Administered 2011-10-21: 0.63 mg via RESPIRATORY_TRACT

## 2011-10-21 MED ORDER — PREDNISONE 20 MG PO TABS: 20.0000 mg | ORAL_TABLET | Freq: Every day | ORAL | Status: AC

## 2011-10-21 NOTE — Progress Notes (Signed)
Patient ID: Gerald [name], male    DOB: 15-Jan-1946, 66 y.o.   MRN: [id number]  HPI 12/26/10- 51 yoM with wife,. Former smoker followed by Dr Tanda Rockers, and coming today on referral from a hospitalist at [facility]. He was dc'd from [place] 2 weeks ago after 3 day stay for acute exacerbation of COPD. Those summaries reviewed.  Current flare began a month ago. He denies routine cough or phlegm at baseline. DOE across parking lot. Still golfs with a cart. Long time smoker. . Sees Pulmonologist Dr Felipa Furnace in [place], but wanted to be seen here. Dx'd w/ COPD in 2010. FEV1 was 0.28%. He now feels he is back to baseline and says it is the best he has been in a year. An ONOX has been scheduled with a local DME company.  CXR 12/13/10- RLL infiltrate suspicious for atelectasis/ pneumonitis with f/u recommended. COPD, probable scarring.  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 Her has been using his inhaler very freuently and we discussed guidelines and alernatives.  02/05/11- COPD, CAD, wife here Since last here has started pulmonary rehab at [facility] and he feels it is helping. Noticing trace ankle edema in recent days.  Feeling a little more bloated and BP goes up with exercise. Continues sleeping with O2 at 2 L/ [facility]. Nebulizer helps clear morning mucus.   06/15/11- 66 year old male former smoker followed for COPD, CAD, .............Gerald [name] wife here He completed pulmonary rehabilitation program at [facility] and says it really didn't help him. He is trying to walk more on his own but has not joined any formal program for exercise. He went to the emergency room September 13 with a COPD exacerbation but denies obvious cold or infection at that time. Chest x-ray there was stable with no new process. He was treated with a steroid shot of antibiotic. Comments on some increase in weight and abdominal girth. Denies palpitation tachycardia or ankle  edema. He often uses his Combivent inhaler several times a day. Using his nebulizer machine with Proformist and budesonide twice every day. Sleeps on oxygen at 2 L.  He does gradually got more congested with increasing phlegm. Is now coughing again over the last few days with throat tickle. We discussed and are going to try again with Daliresp as a nonsteroidal anti-inflammatory.   10/21/11- 66 year old male former smoker followed for COPD, CAD, .............Gerald [name] wife here C/O: "breathing is fair"; producing more phlegm-no color; wheezing, SOB from time to time; Uses Combivent everyday up to 6 times a day(has used 4 times during the night). He is using up a Combivent units every 2 weeks. He stopped maintenance controller medications, Advair then Symbicort. He feels overall better controlled now with no acute flare. Daliresp seems to help. For the past 2 weeks has been coughing more clear mucus. His wife thinks he is on a downward spiral towards another acute exacerbation. He starts out at night in bed, wearing oxygen. Usually moves to his recliner at sometime during the night and does not take the oxygen with him. Using nebulizer with Perforomist / Budesonide twice daily and rarely uses DuoNeb.   Review of Systems- See HPI Constitutional:   No weight loss, night sweats,  Fevers, chills, fatigue, lassitude. HEENT:   No headaches,  Difficulty swallowing,  Tooth/dental problems,  Sore throat,                No sneezing, itching, ear ache, nasal congestion, post nasal  drip,  CV:  No chest pain,  Orthopnea, PND, , anasarca, dizziness, palpitations GI  No heartburn, indigestion, abdominal pain, nausea, vomiting, diarrhea, change in bowel habits, loss of appetite Resp:.  No excess mucus, + productive cough,  No coughing up of blood.  No change in color of mucus.  No wheezing. Skin: no rash or lesions. GU: . MS:  No joint pain or swelling.  No decreased range of motion.  Psych:  No change in mood or affect.  No depression or anxiety.  No memory loss.      Objective:   Physical Exam General- Alert, Oriented, Affect-appropriate, Distress- none acute Skin- rash-none, lesions- none, excoriation- none Lymphadenopathy- none Head- atraumatic            Eyes- Gross vision intact, PERRLA, conjunctivae clear secretions            Ears- Hearing, canals-normal            Nose- Clear, no-Septal dev, mucus, polyps, erosion, perforation             Throat- Mallampati II , mucosa clear , drainage- none, tonsils- atrophic Neck- flexible , trachea midline, no stridor , thyroid nl, carotid no bruit Chest - symmetrical excursion , unlabored           Heart/CV- RRR , no murmur , no gallop  , no rub, nl s1 s2                           - JVD- none , edema- none, stasis changes- none, varices- none           Lung- distant without extra sounds, unlabored., wheeze- none, cough- none , dullness-none, rub- none           Chest wall-  Abd-  Br/ Gen/ Rectal- Not done, not indicated Extrem- cyanosis- none, clubbing, none, atrophy- none, strength- nl Neuro- grossly intact to observation

## 2011-10-21 NOTE — Patient Instructions (Addendum)
Order- ONOX  On O2   Dx COPD   Scripts to hold for prednisone and for amoxacillin in case needed   Neb now-- Xop 0.63

## 2011-10-24 NOTE — Assessment & Plan Note (Signed)
Severe COPD. He is using a lot of bronchodilator, partly because of habit. I educated on this. He is only using 1 puff at a time so his total daily dose is actually reasonable. Plan- overnight oximetry, starting with oxygen on. Since he tends to get up and move to his recliner during the night where he does not wear oxygen, we will record of both patterns to see whether he needs to go to travel to wear oxygen when he moves out to his chair. He is given prednisone 20 mg for 3 days and amoxicillin to hold in case he does develop an active exacerbation.

## 2011-11-06 ENCOUNTER — Telehealth: Payer: Self-pay | Admitting: Internal Medicine

## 2011-11-06 MED ORDER — IPRATROPIUM-ALBUTEROL 20-100 MCG/ACT IN AERS: 1.0000 | INHALATION_SPRAY | Freq: Four times a day (QID) | RESPIRATORY_TRACT | Status: DC | PRN

## 2011-11-06 NOTE — Telephone Encounter (Signed)
I spoke with pt and is aware of the new rx being sent. He voiced his understanding and had no question. New rx has been sent

## 2011-11-06 NOTE — Telephone Encounter (Signed)
I spoke with pt and he stated that [name] told him that combivent will no longer be available the company was going to stop making this. He stated he just received his last fill and it's only for a month supply. Pt is requesting an alternative before he runs out be sent to [facility]. Please advise Dr. Maple Hudson, thanks  No Known Allergies

## 2011-11-06 NOTE — Telephone Encounter (Signed)
Combivent is changing to Combivent Respimat- same drug, different dispenser  Combivent Respimat # 1 (or 3 for mail order)   ONE puff, 4 x daily as needed,   Refill prn

## 2011-12-30 ENCOUNTER — Telehealth: Payer: Self-pay | Admitting: Internal Medicine

## 2011-12-30 MED ORDER — BUDESONIDE 0.5 MG/2ML IN SUSP: 0.5000 mg | Freq: Two times a day (BID) | RESPIRATORY_TRACT | Status: DC

## 2011-12-30 MED ORDER — FORMOTEROL FUMARATE 20 MCG/2ML IN NEBU: 20.0000 ug | INHALATION_SOLUTION | Freq: Two times a day (BID) | RESPIRATORY_TRACT | Status: DC

## 2011-12-30 NOTE — Telephone Encounter (Signed)
Called spoke with patient who stated that he only has enough budesonide to last him until this upcoming Monday and is requesting a short-term rx be sent to [facility] [place].  Pt also stated that [facility] who he has gotten his nebs from in the past is now stating that they will no longer be able to fill his meds b/c medicare will not reimburse.  Requesting meds be sent to [facility] now.    Rx's sent per pt's request.

## 2012-01-11 ENCOUNTER — Telehealth: Payer: Self-pay | Admitting: Internal Medicine

## 2012-01-11 DIAGNOSIS — J449 Chronic obstructive pulmonary disease, unspecified: Secondary | ICD-10-CM

## 2012-01-11 NOTE — Telephone Encounter (Signed)
[email or web address].Govind Furey, CMA  

## 2012-01-12 MED ORDER — BUDESONIDE 0.5 MG/2ML IN SUSP: 0.5000 mg | Freq: Two times a day (BID) | RESPIRATORY_TRACT | Status: DC

## 2012-01-12 MED ORDER — FORMOTEROL FUMARATE 20 MCG/2ML IN NEBU: 20.0000 ug | INHALATION_SOLUTION | Freq: Two times a day (BID) | RESPIRATORY_TRACT | Status: DC

## 2012-01-12 NOTE — Telephone Encounter (Signed)
Spoke with pt.  He would like to proceed with having order placed for [facility] to supply both the budesonide and the perforomist.  Advised to pick up perforomist samples, and call back if he will run out of budesonide prior to meds arriving from [facility].  He verbalized understanding of this.  Nothing further needed at this time  Per Florentina Addison, ok to place order to have [facility] supply these medications for pt.    Order has been placed.  Rxs printed and placed on [name]'s cart for signature.  Once signed, will need to give to [facility].

## 2012-01-12 NOTE — Telephone Encounter (Signed)
Pt returned call. Wants to speak to nurse asap re: meds. [phone]. Hazel Sams

## 2012-01-12 NOTE — Telephone Encounter (Signed)
Called, spoke with pt.  States [facility] is needing a "coverage review" for budesonide and perforomist.  He is currently out of perforomist and has 4 1/2 days of budesonide left.  Advised I will place samples of perforomist at the front until we can get this fixed through [facility].  He verbalized understanding of this.   [employer], spoke with Almira Coaster who states the perforomist and budesonide both need PAs.  Was advised to call [phone].    Called the # above, spoke with [place] who states, because pt has Medicare Part B, they will not be able to provide these meds to him.  They are unable to file part B for mail service.  Pt will have to get meds thru local pharmacy or DME.  Called, spoke with pt.  Advised him of above.  States he was getting meds thru a DME but Medicare stopped reimbursing the DME.  Advised I would send msg to [facility] to advise of a DME who will supply both budesonide and perforomist.  Pt lives in [place], [place].  [facility], pls advise. Thank you.    NOTE:  PT WOULD LIKE THIS TO BE TAKEN CARE OF ASAP TODAY.  THANK YOU.

## 2012-01-12 NOTE — Telephone Encounter (Signed)
Called, spoke with pt.  He is out of perforomist and has approx 6 days of budesonide left.  States he is driving but will call back shortly which information that is needed for [facility].  Will await call back

## 2012-01-12 NOTE — Telephone Encounter (Signed)
i will need order and rx's for this to go to [facility]

## 2012-01-12 NOTE — Telephone Encounter (Signed)
Rx's signed and given to [place] per message.

## 2012-01-13 ENCOUNTER — Telehealth: Payer: Self-pay | Admitting: Internal Medicine

## 2012-01-13 MED ORDER — BUDESONIDE 0.25 MG/2ML IN SUSP: 0.2500 mg | Freq: Two times a day (BID) | RESPIRATORY_TRACT | Status: DC

## 2012-01-13 NOTE — Telephone Encounter (Signed)
Per lecretia [facility] only carried budesonide 0.25 mg and not 0.5 mg. Is this okay to change Dr. Maple Hudson, thanks

## 2012-01-13 NOTE — Telephone Encounter (Signed)
Per CY-yes okay for twice daily.

## 2012-01-13 NOTE — Telephone Encounter (Signed)
I have printed off rx for [name] to sign so we can fax back to [facility]. This was placed on his cart. Please advise thanks

## 2012-01-14 NOTE — Telephone Encounter (Signed)
Per Florentina Addison, the rx has been faxed back to [facility].  Will sign off.

## 2012-01-15 ENCOUNTER — Telehealth: Payer: Self-pay | Admitting: Internal Medicine

## 2012-01-15 NOTE — Telephone Encounter (Signed)
I spoke with pt and advised him the reason he received the o.25 mg was bc [facility] does not carry the o.5 mg. He was fine with this and just wanted to make sure this was okay. Nothing further was needed and will sign off message

## 2012-02-18 ENCOUNTER — Other Ambulatory Visit: Payer: Self-pay

## 2012-03-07 ENCOUNTER — Telehealth: Payer: Self-pay | Admitting: Internal Medicine

## 2012-03-07 MED ORDER — IPRATROPIUM-ALBUTEROL 20-100 MCG/ACT IN AERS: 1.0000 | INHALATION_SPRAY | Freq: Four times a day (QID) | RESPIRATORY_TRACT | Status: DC | PRN

## 2012-03-07 NOTE — Telephone Encounter (Signed)
I spoke with pt and is aware the new combivent inhaler has been sent since [facility] does not show the 1 refill we had sent in feb. He voiced his understanding about new rx being sent and nothing further was needed

## 2012-03-07 NOTE — Telephone Encounter (Signed)
Pt is needing refill on combivent respimate. I advised pt we sent rx 11/06/11 #3 inhalers x 1 refill. He states he is going to call [facility] and see if they have this on file. If not pt will call back

## 2012-03-08 ENCOUNTER — Telehealth: Payer: Self-pay | Admitting: Internal Medicine

## 2012-03-08 NOTE — Telephone Encounter (Signed)
LMTCB

## 2012-03-09 MED ORDER — IPRATROPIUM-ALBUTEROL 18-103 MCG/ACT IN AERO: INHALATION_SPRAY | RESPIRATORY_TRACT | Status: DC

## 2012-03-09 NOTE — Telephone Encounter (Signed)
Spoke with pt to verify msg. He needs one [facility] while awaiting meds to come in the mail. Rx was sent and nothing further needed.

## 2012-03-23 ENCOUNTER — Telehealth: Payer: Self-pay | Admitting: Internal Medicine

## 2012-03-23 NOTE — Telephone Encounter (Signed)
Pt states he needs another refill on his Combivent. He is having to use this medication 1 puff 7-8 times a day. This is in addition to the nebulizer medications. He states he uses this more at nighttime due to his excess mucus production. Dr. Maple Hudson pls advise on sending refills for Combivent.

## 2012-03-24 MED ORDER — PREDNISONE 10 MG PO TABS: 10.0000 mg | ORAL_TABLET | Freq: Every day | ORAL | Status: DC

## 2012-03-24 NOTE — Telephone Encounter (Signed)
He is using too much ipratropium, between his combivent and his nebulizer. Recommend prednisone 10 mg, # 30, 1 daily, no refill.

## 2012-03-24 NOTE — Telephone Encounter (Signed)
I spoke with pt and is aware [name] recs. Rx has been sent to the pharmacy

## 2012-03-28 ENCOUNTER — Other Ambulatory Visit: Payer: Self-pay | Admitting: Internal Medicine

## 2012-04-20 ENCOUNTER — Ambulatory Visit (INDEPENDENT_AMBULATORY_CARE_PROVIDER_SITE_OTHER): Payer: Medicare Other | Admitting: Internal Medicine

## 2012-04-20 ENCOUNTER — Encounter: Payer: Self-pay | Admitting: Internal Medicine

## 2012-04-20 ENCOUNTER — Ambulatory Visit (INDEPENDENT_AMBULATORY_CARE_PROVIDER_SITE_OTHER)
Admission: RE | Admit: 2012-04-20 | Discharge: 2012-04-20 | Disposition: A | Discharge status: Home / Self Care | Payer: Medicare Other | Source: Ambulatory Visit | Attending: Internal Medicine | Admitting: Internal Medicine

## 2012-04-20 VITALS — BP 106/82 | HR 81 | Ht 69.0 in | Wt 175.2 lb

## 2012-04-20 DIAGNOSIS — J449 Chronic obstructive pulmonary disease, unspecified: Secondary | ICD-10-CM

## 2012-04-20 MED ORDER — PREDNISONE 5 MG PO TABS: ORAL_TABLET | ORAL | Status: DC

## 2012-04-20 NOTE — Patient Instructions (Addendum)
Script sent for prednisone. We are going to try to alternate 5 mg with 10 mg every other day as tolerated. Slide back up to 10 mg daily as needed.  Order- CXR  dx COPD  Order- DME ONOX on room air- uses [facility] in [place]

## 2012-04-20 NOTE — Progress Notes (Signed)
Patient ID: Gerald [name], male    DOB: 06-03-1946, 66 y.o.   MRN: [id number]  HPI 12/26/10- 79 yoM with wife,. Former smoker followed by Dr Ginette Otto, and coming today on referral from a hospitalist at [facility]. He was dc'd from [place] 2 weeks ago after 3 day stay for acute exacerbation of COPD. Those summaries reviewed.  Current flare began a month ago. He denies routine cough or phlegm at baseline. DOE across parking lot. Still golfs with a cart. Long time smoker. . Sees Pulmonologist Dr Marlowe Sax in [place], but wanted to be seen here. Dx'd w/ COPD in 2010. FEV1 was 0.28%. He now feels he is back to baseline and says it is the best he has been in a year. An ONOX has been scheduled with a local DME company.  CXR 12/13/10- RLL infiltrate suspicious for atelectasis/ pneumonitis with f/u recommended. COPD, probable scarring.  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 Her has been using his inhaler very freuently and we discussed guidelines and alernatives.  02/05/11- COPD, CAD, wife here Since last here has started pulmonary rehab at [facility] and he feels it is helping. Noticing trace ankle edema in recent days.  Feeling a little more bloated and BP goes up with exercise. Continues sleeping with O2 at 2 L/ [facility]. Nebulizer helps clear morning mucus.   06/15/11- 66 year old male former smoker followed for COPD, CAD, .............[name] wife here He completed pulmonary rehabilitation program at [facility] and says it really didn't help him. He is trying to walk more on his own but has not joined any formal program for exercise. He went to the emergency room September 13 with a COPD exacerbation but denies obvious cold or infection at that time. Chest x-ray there was stable with no new process. He was treated with a steroid shot of antibiotic. Comments on some increase in weight and abdominal girth. Denies palpitation tachycardia or ankle  edema. He often uses his Combivent inhaler several times a day. Using his nebulizer machine with Proformist and budesonide twice every day. Sleeps on oxygen at 2 L.  He does gradually got more congested with increasing phlegm. Is now coughing again over the last few days with throat tickle. We discussed and are going to try again with Daliresp as a nonsteroidal anti-inflammatory.   10/21/11- 66 year old male former smoker followed for COPD, CAD, .............[name] wife here C/O: "breathing is fair"; producing more phlegm-no color; wheezing, SOB from time to time; Uses Combivent everyday up to 6 times a day(has used 4 times during the night). He is using up a Combivent units every 2 weeks. He stopped maintenance controller medications, Advair then Symbicort. He feels overall better controlled now with no acute flare. Daliresp seems to help. For the past 2 weeks has been coughing more clear mucus. His wife thinks he is on a downward spiral towards another acute exacerbation. He starts out at night in bed, wearing oxygen. Usually moves to his recliner at sometime during the night and does not take the oxygen with him. Using nebulizer with Perforomist / Budesonide twice daily and rarely uses DuoNeb.  04/20/12-  66 year old male former smoker followed for COPD, CAD...............[name] wife here COPD-Breathing well(has been on Prednisone for 30 days now)before then was using combivent inhaler too much. Only using about 3-4 times if needed only. Has supplemented with duoneb nebulizer once to twice daily-some days not having to use inhaler or neb at all. We started  daily pred 10 mg maintenance July 11. He was more short of breath with a bronchitis in early summer. We called in prednisone and then which was a big help. He still wheezes now morning cough but he rarely hears his own wheeze. Uses neb- either Perforomist/ budesonide or Duoneb, up to 4 times daily. Carries Combivent. We discussed his ipratropium. Has some history  of BPH but tolerating meds without urinary retention. Continues oxygen for sleep at 2 L/[facility] in [place].  COPD assessment test score 15/40.  Review of Systems- See HPI Constitutional:   No weight loss, night sweats,  Fevers, chills, fatigue, lassitude. HEENT:   No headaches,  Difficulty swallowing,  Tooth/dental problems,  Sore throat,                No sneezing, itching, ear ache, nasal congestion, post nasal drip,  CV:  No chest pain,  Orthopnea, PND, , anasarca, dizziness, palpitations GI  No heartburn, indigestion, abdominal pain, nausea, vomiting, diarrhea, change in bowel habits, loss of appetite Resp:.  No excess mucus, + productive cough,  No coughing up of blood.  No change in color of mucus.  No wheezing. Skin: no rash or lesions. GU: . MS:  No joint pain or swelling.  No decreased range of motion.  Psych:  No change in mood or affect. No depression or anxiety.  No memory loss.   Objective:   Physical Exam General- Alert, Oriented, Affect-appropriate, Distress- none acute, talkative Skin- rash-none, lesions- none, excoriation- none Lymphadenopathy- none Head- atraumatic            Eyes- Gross vision intact, PERRLA, conjunctivae clear secretions            Ears- Hearing, canals-normal            Nose- Clear, no-Septal dev, mucus, polyps, erosion, perforation             Throat- Mallampati II , mucosa clear , drainage- none, tonsils- atrophic Neck- flexible , trachea midline, no stridor , thyroid nl, carotid no bruit Chest - symmetrical excursion , unlabored           Heart/CV- RRR , no murmur , no gallop  , no rub, nl s1 s2                           - JVD- none , edema- none, stasis changes- none, varices- none           Lung- distant without extra sounds, unlabored., wheeze- none, cough- none , dullness-none, rub- none           Chest wall-  Abd-  Br/ Gen/ Rectal- Not done, not indicated Extrem- cyanosis- none, clubbing, none, atrophy- none, strength-  nl Neuro- grossly intact to observation

## 2012-04-21 NOTE — Progress Notes (Signed)
Quick Note:  Pt aware of results. ______ 

## 2012-04-27 NOTE — Assessment & Plan Note (Signed)
Plan- steroid therapy and side effects discussed again. Try reducing prednisone to 10 mg alternating with 5 mg every other day Order CXR

## 2012-05-30 ENCOUNTER — Telehealth: Payer: Self-pay | Admitting: Internal Medicine

## 2012-05-30 MED ORDER — IPRATROPIUM-ALBUTEROL 0.5-2.5 (3) MG/3ML IN SOLN: 3.0000 mL | RESPIRATORY_TRACT | Status: DC | PRN

## 2012-05-30 NOTE — Telephone Encounter (Signed)
Refill sent. Pt is aware. Jennifer Castillo, CMA  

## 2012-06-02 ENCOUNTER — Telehealth: Payer: Self-pay | Admitting: Internal Medicine

## 2012-06-02 MED ORDER — IPRATROPIUM-ALBUTEROL 0.5-2.5 (3) MG/3ML IN SOLN: 3.0000 mL | RESPIRATORY_TRACT | Status: DC | PRN

## 2012-06-02 NOTE — Telephone Encounter (Signed)
[  t will need to get his nebulizer medication through [facility] since he has Medicare. The pt is aware and agrees with this. I will print the prescriptions for [name] to sign and then we will give to the [facility] so they may get this to [facility].RX printed and placed on [name] cart.

## 2012-06-03 NOTE — Telephone Encounter (Signed)
The Rx was given to [facility] to give to [facility].

## 2012-06-03 NOTE — Telephone Encounter (Signed)
Katie, did this get taken care of for the pt? Pls advise.

## 2012-06-14 ENCOUNTER — Telehealth: Payer: Self-pay | Admitting: Internal Medicine

## 2012-06-14 NOTE — Telephone Encounter (Signed)
Spoke to [email or web address] she is faxing a per for dr young to sign Gerald [name]

## 2012-06-14 NOTE — Telephone Encounter (Signed)
Gerald [name], looks like you faxed [facility] in [place] order for ONO on RA on 04/20/12 Pt stating there is something further needed Please help thanks

## 2012-06-28 ENCOUNTER — Encounter: Payer: Self-pay | Admitting: Internal Medicine

## 2012-08-17 ENCOUNTER — Telehealth: Payer: Self-pay | Admitting: Internal Medicine

## 2012-08-17 MED ORDER — IPRATROPIUM-ALBUTEROL 20-100 MCG/ACT IN AERS: 1.0000 | INHALATION_SPRAY | Freq: Four times a day (QID) | RESPIRATORY_TRACT | Status: DC | PRN

## 2012-08-17 NOTE — Telephone Encounter (Signed)
Rx was sent to pharm  LMOM for the pt to be made aware 

## 2012-10-06 ENCOUNTER — Other Ambulatory Visit: Payer: Self-pay | Admitting: Internal Medicine

## 2012-10-11 ENCOUNTER — Telehealth: Payer: Self-pay | Admitting: Internal Medicine

## 2012-10-11 MED ORDER — ROFLUMILAST 500 MCG PO TABS: 500.0000 ug | ORAL_TABLET | Freq: Every day | ORAL | Status: DC

## 2012-10-11 NOTE — Telephone Encounter (Signed)
Pt aware rx has been sent. Nothing further was needed 

## 2012-10-13 ENCOUNTER — Telehealth: Payer: Self-pay | Admitting: Internal Medicine

## 2012-10-13 MED ORDER — ROFLUMILAST 500 MCG PO TABS: 500.0000 ug | ORAL_TABLET | Freq: Every day | ORAL | Status: DC

## 2012-10-13 NOTE — Telephone Encounter (Signed)
I have recalled rx into the pharmacy. Pt aware nothing further was needed

## 2012-10-17 ENCOUNTER — Telehealth: Payer: Self-pay | Admitting: Internal Medicine

## 2012-10-17 MED ORDER — ROFLUMILAST 500 MCG PO TABS: 500.0000 ug | ORAL_TABLET | Freq: Every day | ORAL | Status: DC

## 2012-10-17 NOTE — Telephone Encounter (Signed)
Left message that we have sent Rx to local pharmacy and patient can pick up as able. If any questions or concerns to call our office.

## 2012-10-17 NOTE — Telephone Encounter (Signed)
Pt aware that I have called in this prescription instead of sending it electronically.

## 2012-10-19 ENCOUNTER — Ambulatory Visit (INDEPENDENT_AMBULATORY_CARE_PROVIDER_SITE_OTHER): Payer: BC Managed Care – HMO | Admitting: Internal Medicine

## 2012-10-19 ENCOUNTER — Encounter: Payer: Self-pay | Admitting: Internal Medicine

## 2012-10-19 VITALS — BP 140/82 | HR 89 | Ht 68.5 in | Wt 182.4 lb

## 2012-10-19 DIAGNOSIS — J449 Chronic obstructive pulmonary disease, unspecified: Secondary | ICD-10-CM

## 2012-10-19 NOTE — Patient Instructions (Addendum)
Suggest trying 1/2 prednisone 5 mg tab daily = 2.5 mg  Encourage walking  Encourage use of O2 at night

## 2012-10-19 NOTE — Assessment & Plan Note (Addendum)
Discussed O2 and prednisone. Still finds Daliresp helpful. Plan- We will try to reduce O2 some as discussed while continuing dalresp. Encourage O2 during sleep.

## 2012-10-19 NOTE — Progress Notes (Signed)
Patient ID: Gerald [name], male    DOB: 06-03-1946, 67 y.o.   MRN: [id number]  HPI 12/26/10- 79 yoM with wife,. Former smoker followed by Dr Ginette Otto, and coming today on referral from a hospitalist at [facility]. He was dc'd from [place] 2 weeks ago after 3 day stay for acute exacerbation of COPD. Those summaries reviewed.  Current flare began a month ago. He denies routine cough or phlegm at baseline. DOE across parking lot. Still golfs with a cart. Long time smoker. . Sees Pulmonologist Dr Marlowe Sax in [place], but wanted to be seen here. Dx'd w/ COPD in 2010. FEV1 was 0.28%. He now feels he is back to baseline and says it is the best he has been in a year. An ONOX has been scheduled with a local DME company.  CXR 12/13/10- RLL infiltrate suspicious for atelectasis/ pneumonitis with f/u recommended. COPD, probable scarring.  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 Her has been using his inhaler very freuently and we discussed guidelines and alernatives.  02/05/11- COPD, CAD, wife here Since last here has started pulmonary rehab at [facility] and he feels it is helping. Noticing trace ankle edema in recent days.  Feeling a little more bloated and BP goes up with exercise. Continues sleeping with O2 at 2 L/ [facility]. Nebulizer helps clear morning mucus.   06/15/11- 67 year old male former smoker followed for COPD, CAD, .............[name] wife here He completed pulmonary rehabilitation program at [facility] and says it really didn't help him. He is trying to walk more on his own but has not joined any formal program for exercise. He went to the emergency room September 13 with a COPD exacerbation but denies obvious cold or infection at that time. Chest x-ray there was stable with no new process. He was treated with a steroid shot of antibiotic. Comments on some increase in weight and abdominal girth. Denies palpitation tachycardia or ankle  edema. He often uses his Combivent inhaler several times a day. Using his nebulizer machine with Proformist and budesonide twice every day. Sleeps on oxygen at 2 L.  He does gradually got more congested with increasing phlegm. Is now coughing again over the last few days with throat tickle. We discussed and are going to try again with Daliresp as a nonsteroidal anti-inflammatory.   10/21/11- 67 year old male former smoker followed for COPD, CAD, .............[name] wife here C/O: "breathing is fair"; producing more phlegm-no color; wheezing, SOB from time to time; Uses Combivent everyday up to 6 times a day(has used 4 times during the night). He is using up a Combivent units every 2 weeks. He stopped maintenance controller medications, Advair then Symbicort. He feels overall better controlled now with no acute flare. Daliresp seems to help. For the past 2 weeks has been coughing more clear mucus. His wife thinks he is on a downward spiral towards another acute exacerbation. He starts out at night in bed, wearing oxygen. Usually moves to his recliner at sometime during the night and does not take the oxygen with him. Using nebulizer with Perforomist / Budesonide twice daily and rarely uses DuoNeb.  04/20/12-  67 year old male former smoker followed for COPD, CAD...............[name] wife here COPD-Breathing well(has been on Prednisone for 30 days now)before then was using combivent inhaler too much. Only using about 3-4 times if needed only. Has supplemented with duoneb nebulizer once to twice daily-some days not having to use inhaler or neb at all. We started  daily pred 10 mg maintenance July 11. He was more short of breath with a bronchitis in early summer. We called in prednisone and then which was a big help. He still wheezes now morning cough but he rarely hears his own wheeze. Uses neb- either Perforomist/ budesonide or Duoneb, up to 4 times daily. Carries Combivent. We discussed his ipratropium. Has some history  of BPH but tolerating meds without urinary retention. Continues oxygen for sleep at 2 L/[facility] in [place].  COPD assessment test score 15/40.  10/19/12- 67 year old male former smoker followed for COPD, CAD...............[name] wife here FOLLOWS FOR: No increase in SOB or wheezing since last visit. Good winter "fine".  ONOX 06/15/12- still qualifies for O2 sleep- 10 minutes< 89% on room air. Often takes O2 off during night - cannula is "annoying". Discussed. Continues prednisone 5 mg daily, reduced by him from 10/ 5 qod after last ov here. Pred talk.  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  Original Report Authenticated By: Gwynn Burly, M.D.  Review of Systems- See HPI Constitutional:   No weight loss, night sweats,  Fevers, chills, fatigue, lassitude. HEENT:   No headaches,  Difficulty swallowing,  Tooth/dental problems,  Sore throat,                No sneezing, itching, ear ache, nasal congestion, post nasal drip,  CV:  No chest pain,  Orthopnea, PND, , anasarca, dizziness, palpitations GI  No heartburn, indigestion, abdominal pain, nausea, vomiting, diarrhea, change in bowel habits, loss of appetite Resp:.  No excess mucus, No- productive cough,  No coughing up of blood.  No change in color of mucus.  No wheezing. Skin: no rash or lesions. GU: . MS:  No joint pain or swelling.  No decreased range of motion.  Psych:  No change in mood or affect. No depression or anxiety.  No memory loss.   Objective:   Physical Exam General- Alert, Oriented, Affect-appropriate, Distress- none acute, talkative Skin- rash-none, lesions- none, excoriation- none Lymphadenopathy- none Head- atraumatic            Eyes- Gross vision intact, PERRLA, conjunctivae clear secretions            Ears- Hearing, canals-normal            Nose- Clear, no-Septal dev, mucus, polyps, erosion, perforation             Throat- Mallampati II , mucosa clear , drainage- none, tonsils-  atrophic Neck- flexible , trachea midline, no stridor , thyroid nl, carotid no bruit Chest - symmetrical excursion , unlabored           Heart/CV- RRR , no murmur , no gallop  , no rub, nl s1 s2                           - JVD- none , edema- none, stasis changes- none, varices- none           Lung- +distant without extra sounds, unlabored, wheeze- none, cough- none , dullness-none, rub- none           Chest wall-  Abd-  Br/ Gen/ Rectal- Not done, not indicated Extrem- cyanosis- none, clubbing, none, atrophy- none, strength- nl Neuro- grossly intact to observation

## 2012-10-25 ENCOUNTER — Other Ambulatory Visit: Payer: Self-pay

## 2013-02-21 ENCOUNTER — Other Ambulatory Visit: Payer: Self-pay | Admitting: Internal Medicine

## 2013-04-15 ENCOUNTER — Other Ambulatory Visit: Payer: Self-pay | Admitting: Internal Medicine

## 2013-04-19 ENCOUNTER — Encounter: Payer: Self-pay | Admitting: Internal Medicine

## 2013-04-19 ENCOUNTER — Ambulatory Visit (INDEPENDENT_AMBULATORY_CARE_PROVIDER_SITE_OTHER): Payer: Medicare Other | Admitting: Internal Medicine

## 2013-04-19 VITALS — BP 110/70 | HR 81 | Ht 68.5 in | Wt 177.4 lb

## 2013-04-19 DIAGNOSIS — J449 Chronic obstructive pulmonary disease, unspecified: Secondary | ICD-10-CM

## 2013-04-19 DIAGNOSIS — J439 Emphysema, unspecified: Secondary | ICD-10-CM

## 2013-04-19 DIAGNOSIS — J438 Other emphysema: Secondary | ICD-10-CM

## 2013-04-19 MED ORDER — IPRATROPIUM-ALBUTEROL 20-100 MCG/ACT IN AERS: 1.0000 | INHALATION_SPRAY | Freq: Four times a day (QID) | RESPIRATORY_TRACT | Status: DC | PRN

## 2013-04-19 MED ORDER — FLUTICASONE FUROATE-VILANTEROL 100-25 MCG/INH IN AEPB: 1.0000 | INHALATION_SPRAY | RESPIRATORY_TRACT | Status: DC

## 2013-04-19 MED ORDER — FLUTICASONE FUROATE-VILANTEROL 100-25 MCG/INH IN AEPB: 1.0000 | INHALATION_SPRAY | Freq: Every day | RESPIRATORY_TRACT | Status: DC

## 2013-04-19 NOTE — Patient Instructions (Addendum)
Order CXR   Dx COPD  Sample and script Breo Ellipta    1 puff then rinse mouth, once daily     This would replace Perforomist/ Budesonide  Refill script for Combivent

## 2013-04-19 NOTE — Progress Notes (Signed)
Patient ID: Gerald [name], male    DOB: 06-03-1946, 67 y.o.   MRN: [id number]  HPI 12/26/10- 79 yoM with wife,. Former smoker followed by Dr Ginette Otto, and coming today on referral from a hospitalist at [facility]. He was dc'd from [place] 2 weeks ago after 3 day stay for acute exacerbation of COPD. Those summaries reviewed.  Current flare began a month ago. He denies routine cough or phlegm at baseline. DOE across parking lot. Still golfs with a cart. Long time smoker. . Sees Pulmonologist Dr Gerald [name] in [place], but wanted to be seen here. Dx'd w/ COPD in 2010. FEV1 was 0.28%. He now feels he is back to baseline and says it is the best he has been in a year. An ONOX has been scheduled with a local DME company.  CXR 12/13/10- RLL infiltrate suspicious for atelectasis/ pneumonitis with f/u recommended. COPD, probable scarring.  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 Her has been using his inhaler very freuently and we discussed guidelines and alernatives.  02/05/11- COPD, CAD, wife here Since last here has started pulmonary rehab at [facility] and he feels it is helping. Noticing trace ankle edema in recent days.  Feeling a little more bloated and BP goes up with exercise. Continues sleeping with O2 at 2 L/ [facility]. Nebulizer helps clear morning mucus.   06/15/11- 67 year old male former smoker followed for COPD, CAD, .............Gerald [name] wife here He completed pulmonary rehabilitation program at [facility] and says it really didn't help him. He is trying to walk more on his own but has not joined any formal program for exercise. He went to the emergency room September 13 with a COPD exacerbation but denies obvious cold or infection at that time. Chest x-ray there was stable with no new process. He was treated with a steroid shot of antibiotic. Comments on some increase in weight and abdominal girth. Denies palpitation tachycardia or ankle  edema. He often uses his Combivent inhaler several times a day. Using his nebulizer machine with Proformist and budesonide twice every day. Sleeps on oxygen at 2 L.  He does gradually got more congested with increasing phlegm. Is now coughing again over the last few days with throat tickle. We discussed and are going to try again with Daliresp as a nonsteroidal anti-inflammatory.   10/21/11- 67 year old male former smoker followed for COPD, CAD, .............Gerald [name] wife here C/O: "breathing is fair"; producing more phlegm-no color; wheezing, SOB from time to time; Uses Combivent everyday up to 6 times a day(has used 4 times during the night). He is using up a Combivent units every 2 weeks. He stopped maintenance controller medications, Advair then Symbicort. He feels overall better controlled now with no acute flare. Daliresp seems to help. For the past 2 weeks has been coughing more clear mucus. His wife thinks he is on a downward spiral towards another acute exacerbation. He starts out at night in bed, wearing oxygen. Usually moves to his recliner at sometime during the night and does not take the oxygen with him. Using nebulizer with Perforomist / Budesonide twice daily and rarely uses DuoNeb.  04/20/12-  67 year old male former smoker followed for COPD, CAD...............Gerald [name] wife here COPD-Breathing well(has been on Prednisone for 30 days now)before then was using combivent inhaler too much. Only using about 3-4 times if needed only. Has supplemented with duoneb nebulizer once to twice daily-some days not having to use inhaler or neb at all. We started  daily pred 10 mg maintenance July 11. He was more short of breath with a bronchitis in early summer. We called in prednisone and then which was a big help. He still wheezes now morning cough but he rarely hears his own wheeze. Uses neb- either Perforomist/ budesonide or Duoneb, up to 4 times daily. Carries Combivent. We discussed his ipratropium. Has some history  of BPH but tolerating meds without urinary retention. Continues oxygen for sleep at 2 L/[facility] in [place].  COPD assessment test score 15/40.  10/19/12- 67 year old male former smoker followed for COPD, CAD...............Gerald [name] wife here FOLLOWS FOR: No increase in SOB or wheezing since last visit. Good winter "fine".  ONOX 06/15/12- still qualifies for O2 sleep- 10 minutes< 89% on room air. Often takes O2 off during night - cannula is "annoying". Discussed. Continues prednisone 5 mg daily, reduced by him from 10/ 5 qod after last ov here. Pred talk.  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  Original Report Authenticated By: Gerald [name], M.D.  04/19/13- 10/19/12- 67 year old male former smoker followed for COPD, CAD...............Gerald [name] wife here 6 month followup, has questions regarding medications Using nebulizer 3 or 4 times daily. Continues prednisone 2.5 mg daily. Interested in trying [employer] from TV ad. We discussed his lisinopril again. He feels comfortable staying on it. Continues O2 2L sleep/ [facility] in [place].  Review of Systems- See HPI Constitutional:   No weight loss, night sweats,  Fevers, chills, fatigue, lassitude. HEENT:   No headaches,  Difficulty swallowing,  Tooth/dental problems,  Sore throat,                No sneezing, itching, ear ache, nasal congestion, post nasal drip,  CV:  No chest pain,  Orthopnea, PND, , anasarca, dizziness, palpitations GI  No heartburn, indigestion, abdominal pain, nausea, vomiting, diarrhea, change in bowel habits, loss of appetite Resp:.  No excess mucus, No- productive cough,  No coughing up of blood.  No change in color of mucus.  No wheezing. Skin: no rash or lesions. GU: . MS:  No joint pain or swelling.  No decreased range of motion.  Psych:  No change in mood or affect. No depression or anxiety.  No memory loss.   Objective:   Physical Exam General- Alert, Oriented, Affect-appropriate,  Distress- none acute, talkative, overweight Skin- rash-none, lesions- none, excoriation- none Lymphadenopathy- none Head- atraumatic            Eyes- Gross vision intact, PERRLA, conjunctivae clear secretions            Ears- Hearing, canals-normal            Nose- Clear, no-Septal dev, mucus, polyps, erosion, perforation             Throat- Mallampati II , mucosa clear , drainage- none, tonsils- atrophic Neck- flexible , trachea midline, no stridor , thyroid nl, carotid no bruit Chest - symmetrical excursion , unlabored           Heart/CV- RRR , no murmur , no gallop  , no rub, nl s1 s2                           - JVD- none , edema- none, stasis changes- none, varices- none           Lung- +distant without extra sounds, unlabored, wheeze- none, cough- none , dullness-none, rub- none  Chest wall-  Abd-  Br/ Gen/ Rectal- Not done, not indicated Extrem- cyanosis- none, clubbing, none, atrophy- none, strength- nl Neuro- grossly intact to observation

## 2013-04-20 ENCOUNTER — Ambulatory Visit: Payer: Medicare Other | Admitting: Internal Medicine

## 2013-05-01 ENCOUNTER — Encounter: Payer: Self-pay | Admitting: Internal Medicine

## 2013-05-01 ENCOUNTER — Ambulatory Visit (INDEPENDENT_AMBULATORY_CARE_PROVIDER_SITE_OTHER)
Admission: RE | Admit: 2013-05-01 | Discharge: 2013-05-01 | Disposition: A | Discharge status: Home / Self Care | Payer: Medicare Other | Source: Ambulatory Visit | Attending: Internal Medicine | Admitting: Internal Medicine

## 2013-05-01 DIAGNOSIS — J4489 Other specified chronic obstructive pulmonary disease: Secondary | ICD-10-CM

## 2013-05-01 DIAGNOSIS — J449 Chronic obstructive pulmonary disease, unspecified: Secondary | ICD-10-CM

## 2013-05-01 NOTE — Assessment & Plan Note (Signed)
Not much has changed from last visit. At like to try dropping prednisone or at least reducing to every other day. Steroid talk done. We discussed Breo Ellipta as possible replacement for pulmicort and Perforomist neb. CXR

## 2013-05-03 ENCOUNTER — Telehealth: Payer: Self-pay | Admitting: Internal Medicine

## 2013-05-03 NOTE — Telephone Encounter (Signed)
Notes Recorded by Waymon Budge, MD on 05/01/2013 at 5:51 PM CXR- changes of COPD with emphysema and old scarring.. Nothing looks new or active.  I spoke with patient about results and he verbalized understanding and had no questions

## 2013-05-19 ENCOUNTER — Other Ambulatory Visit: Payer: Self-pay

## 2013-06-07 ENCOUNTER — Encounter: Payer: Self-pay | Admitting: Internal Medicine

## 2013-06-26 ENCOUNTER — Telehealth: Payer: Self-pay | Admitting: Internal Medicine

## 2013-06-26 NOTE — Telephone Encounter (Signed)
Spoke with patient-states he spoke with [facility] and requested refills for his nebulizer and they stated that they would ship asap. Pt is out of his Duoneb at this time but still has Performist and Pulmicort-per CY continue to use this in the meantime since there is no acute issues going on. Pt is aware.

## 2013-06-27 ENCOUNTER — Telehealth: Payer: Self-pay | Admitting: Internal Medicine

## 2013-06-27 NOTE — Telephone Encounter (Signed)
Called, spoke with pt.  Pt states he has received a VM from [facility] since leaving this msg.  Per pt, the [facility] VM states they have now received everything that is needed to ship his meds and this will be done today or tomorrow.  Pt states nothing further is needed at this time.  He will call back if anything further is needed.

## 2013-08-04 ENCOUNTER — Telehealth: Payer: Self-pay | Admitting: Internal Medicine

## 2013-08-04 DIAGNOSIS — J449 Chronic obstructive pulmonary disease, unspecified: Secondary | ICD-10-CM

## 2013-08-04 MED ORDER — BUDESONIDE 0.5 MG/2ML IN SUSP: 0.5000 mg | Freq: Two times a day (BID) | RESPIRATORY_TRACT | Status: DC

## 2013-08-04 MED ORDER — IPRATROPIUM-ALBUTEROL 0.5-2.5 (3) MG/3ML IN SOLN: 3.0000 mL | RESPIRATORY_TRACT | Status: DC | PRN

## 2013-08-04 MED ORDER — FORMOTEROL FUMARATE 20 MCG/2ML IN NEBU: 20.0000 ug | INHALATION_SOLUTION | Freq: Two times a day (BID) | RESPIRATORY_TRACT | Status: DC

## 2013-08-04 NOTE — Telephone Encounter (Signed)
Pt can no longer get his neb meds at [facility] due to his insurance. DME will need to be changed.  Per CY - okay to change DME.  I will print out new rx's and get this message to [facility] so they can get him his medications.

## 2013-08-04 NOTE — Telephone Encounter (Signed)
Called and spoke with patient and advised that Rx and referral has been faxed to [facility]. Pt advised that [facility] will be contacting him to arrange shipment. Rhonda J Cobb Nothing else needed at this time. Rhonda J Cobb

## 2013-08-29 ENCOUNTER — Telehealth: Payer: Self-pay | Admitting: Internal Medicine

## 2013-08-29 NOTE — Telephone Encounter (Signed)
I spoke with the pt and he states now that he has switched to [facility] they will only give him duoneb once a day, when it is prescribed every 4 hours as needed. He states that when he was with [facility] he would get 2 a day and then would pay out of pocket for the rest with [facility]. I advised the pt that I will contact [facility] to see what can be done. I have LMTCB with Mandy at [phone]. Carron Curie, CMA

## 2013-08-29 NOTE — Telephone Encounter (Signed)
Mandy w/ [employer] returned call & can be reached at [phone].  Antionette Fairy

## 2013-08-29 NOTE — Telephone Encounter (Signed)
lmomtcb x2 for [employer]

## 2013-08-31 NOTE — Telephone Encounter (Signed)
LMTCB for Mandy. Carron Curie, CMA

## 2013-08-31 NOTE — Telephone Encounter (Signed)
Mandy from [facility] called back and stated that they are not able to fill the pts duoneb for QID---only for QD prn.   mandy suggestions were to keep the budesonide and the performist at [facility] since these are the more expensive meds, and fill the duoneb from his local pharmacy, that way he can fill it for the qid as it was written.  lmomtcb for the pt to discuss with him.

## 2013-09-01 NOTE — Telephone Encounter (Signed)
lmtcb x2 

## 2013-09-04 MED ORDER — IPRATROPIUM-ALBUTEROL 0.5-2.5 (3) MG/3ML IN SOLN: 3.0000 mL | RESPIRATORY_TRACT | Status: DC | PRN

## 2013-09-04 NOTE — Telephone Encounter (Signed)
Spoke with the pt and notified of conversation with [facility] He verbalized understanding and was fine with sending duoneb to local pharm This was done and nothing further needed per pt

## 2013-09-04 NOTE — Telephone Encounter (Signed)
Returning call.

## 2013-10-25 ENCOUNTER — Ambulatory Visit (INDEPENDENT_AMBULATORY_CARE_PROVIDER_SITE_OTHER): Payer: Medicare Other | Admitting: Internal Medicine

## 2013-10-25 ENCOUNTER — Encounter: Payer: Self-pay | Admitting: Internal Medicine

## 2013-10-25 VITALS — BP 112/74 | HR 96 | Ht 68.5 in | Wt 173.2 lb

## 2013-10-25 DIAGNOSIS — J438 Other emphysema: Secondary | ICD-10-CM

## 2013-10-25 DIAGNOSIS — J439 Emphysema, unspecified: Secondary | ICD-10-CM

## 2013-10-25 DIAGNOSIS — Z23 Encounter for immunization: Secondary | ICD-10-CM

## 2013-10-25 NOTE — Patient Instructions (Addendum)
Prevnar conjugate pneumococcal vaccine 13  We can continue present meds and O2 2l for sleep  Booklet on obstructive sleep apnea to consider

## 2013-10-25 NOTE — Progress Notes (Signed)
Patient ID: Gerald [name], male    DOB: 06-03-1946, 68 y.o.   MRN: [id number]  HPI 12/26/10- 79 yoM with wife,. Former smoker followed by Dr Ginette Otto, and coming today on referral from a hospitalist at [facility]. He was dc'd from [place] 2 weeks ago after 3 day stay for acute exacerbation of COPD. Those summaries reviewed.  Current flare began a month ago. He denies routine cough or phlegm at baseline. DOE across parking lot. Still golfs with a cart. Long time smoker. . Sees Pulmonologist Dr Marlowe Sax in [place], but wanted to be seen here. Dx'd w/ COPD in 2010. FEV1 was 0.28%. He now feels he is back to baseline and says it is the best he has been in a year. An ONOX has been scheduled with a local DME company.  CXR 12/13/10- RLL infiltrate suspicious for atelectasis/ pneumonitis with f/u recommended. COPD, probable scarring.  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 Her has been using his inhaler very freuently and we discussed guidelines and alernatives.  02/05/11- COPD, CAD, wife here Since last here has started pulmonary rehab at [facility] and he feels it is helping. Noticing trace ankle edema in recent days.  Feeling a little more bloated and BP goes up with exercise. Continues sleeping with O2 at 2 L/ [facility]. Nebulizer helps clear morning mucus.   06/15/11- 68 year old male former smoker followed for COPD, CAD, .............[name] wife here He completed pulmonary rehabilitation program at [facility] and says it really didn't help him. He is trying to walk more on his own but has not joined any formal program for exercise. He went to the emergency room September 13 with a COPD exacerbation but denies obvious cold or infection at that time. Chest x-ray there was stable with no new process. He was treated with a steroid shot of antibiotic. Comments on some increase in weight and abdominal girth. Denies palpitation tachycardia or ankle  edema. He often uses his Combivent inhaler several times a day. Using his nebulizer machine with Proformist and budesonide twice every day. Sleeps on oxygen at 2 L.  He does gradually got more congested with increasing phlegm. Is now coughing again over the last few days with throat tickle. We discussed and are going to try again with Daliresp as a nonsteroidal anti-inflammatory.   10/21/11- 68 year old male former smoker followed for COPD, CAD, .............[name] wife here C/O: "breathing is fair"; producing more phlegm-no color; wheezing, SOB from time to time; Uses Combivent everyday up to 6 times a day(has used 4 times during the night). He is using up a Combivent units every 2 weeks. He stopped maintenance controller medications, Advair then Symbicort. He feels overall better controlled now with no acute flare. Daliresp seems to help. For the past 2 weeks has been coughing more clear mucus. His wife thinks he is on a downward spiral towards another acute exacerbation. He starts out at night in bed, wearing oxygen. Usually moves to his recliner at sometime during the night and does not take the oxygen with him. Using nebulizer with Perforomist / Budesonide twice daily and rarely uses DuoNeb.  04/20/12-  68 year old male former smoker followed for COPD, CAD...............[name] wife here COPD-Breathing well(has been on Prednisone for 30 days now)before then was using combivent inhaler too much. Only using about 3-4 times if needed only. Has supplemented with duoneb nebulizer once to twice daily-some days not having to use inhaler or neb at all. We started  daily pred 10 mg maintenance July 11. He was more short of breath with a bronchitis in early summer. We called in prednisone and then which was a big help. He still wheezes now morning cough but he rarely hears his own wheeze. Uses neb- either Perforomist/ budesonide or Duoneb, up to 4 times daily. Carries Combivent. We discussed his ipratropium. Has some history  of BPH but tolerating meds without urinary retention. Continues oxygen for sleep at 2 L/[facility] in [place].  COPD assessment test score 15/40.  10/19/12- 67 year old male former smoker followed for COPD, CAD...............[name] wife here FOLLOWS FOR: No increase in SOB or wheezing since last visit. Good winter "fine".  ONOX 06/15/12- still qualifies for O2 sleep- 10 minutes< 89% on room air. Often takes O2 off during night - cannula is "annoying". Discussed. Continues prednisone 5 mg daily, reduced by him from 10/ 5 qod after last ov here. Pred talk.  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  Original Report Authenticated By: Larey Seat, M.D.  04/19/13- 10/19/12- 68 year old male former smoker followed for COPD, CAD...............[name] wife here 6 month followup, has questions regarding medications Using nebulizer 3 or 4 times daily. Continues prednisone 2.5 mg daily. Interested in trying [employer] from [place] ad. We discussed his lisinopril again. He feels comfortable staying on it. Continues O2 2L sleep/ [facility] in [place].  10/25/13-  10/19/12- 68 year old male former smoker followed for COPD, CAD...............[name] wife here FOLLOWS FOR: Pt reports breathing with some improvement--No concerns today Continues O2 2L sleep/ [facility] in [place] Patient has had a good winter with no special problems. Less dyspnea on exertion and walking more. Memory Dance was too expensive to consider as an option but current meds okay. Wife participated in a discussion of possible sleep apnea symptoms. He agreed to take a booklet. CXR 05/03/13 IMPRESSION:  COPD changes.  No acute abnormalities.  Original Report Authenticated By: Lavonia Dana, MD  Review of Systems- See HPI Constitutional:   No weight loss, night sweats,  Fevers, chills, fatigue, lassitude. HEENT:   No headaches,  Difficulty swallowing,  Tooth/dental problems,  Sore throat,                No sneezing, itching,  ear ache, nasal congestion, post nasal drip,  CV:  No chest pain,  Orthopnea, PND, , anasarca, dizziness, palpitations GI  No heartburn, indigestion, abdominal pain, nausea, vomiting, diarrhea, change in bowel habits, loss                 of appetite Resp:.  No excess mucus, No- productive cough,  No coughing up of blood.  No change in color of                  mucus.  No wheezing. Skin: no rash or lesions. GU: . MS:  No joint pain or swelling.  No decreased range of motion.  Psych:  No change in mood or affect. No depression or anxiety.  No memory loss.   Objective:   Physical Exam General- Alert, Oriented, Affect-appropriate, Distress- none acute, talkative, overweight Skin- rash-none, lesions- none, excoriation- none Lymphadenopathy- none Head- atraumatic            Eyes- Gross vision intact, PERRLA, conjunctivae clear secretions            Ears- Hearing aids            Nose- Clear, no-Septal dev, mucus, polyps, erosion, perforation  Throat- Mallampati II , mucosa clear , drainage- none, tonsils- atrophic Neck- flexible , trachea midline, no stridor , thyroid nl, carotid no bruit Chest - symmetrical excursion , unlabored           Heart/CV- RRR , no murmur , no gallop  , no rub, nl s1 s2                           - JVD- none , edema- none, stasis changes- none, varices- none           Lung- +distant , unlabored, wheeze+trace, cough- none , dullness-none, rub- none           Chest wall-  Abd-  Br/ Gen/ Rectal- Not done, not indicated Extrem- cyanosis- none, clubbing, none, atrophy- none, strength- nl Neuro- grossly intact to observation

## 2013-11-17 NOTE — Assessment & Plan Note (Signed)
Stable with no new issues. Plan-continue oxygen and sleep, Prevnar pneumonia vaccine

## 2013-12-16 ENCOUNTER — Other Ambulatory Visit: Payer: Self-pay | Admitting: Internal Medicine

## 2013-12-21 ENCOUNTER — Telehealth: Payer: Self-pay | Admitting: Internal Medicine

## 2013-12-21 NOTE — Telephone Encounter (Signed)
Called [phone]. Spoke with [employer]. Was advised daliresp did need PA. Initiated this over the phone. This was approved from 11/21/13-12/30/16 Case ID # [id number] Spouse aware. Nothing further needd

## 2014-04-17 ENCOUNTER — Ambulatory Visit: Payer: Medicare Other | Admitting: Internal Medicine

## 2014-04-24 ENCOUNTER — Encounter: Payer: Self-pay | Admitting: Internal Medicine

## 2014-04-24 ENCOUNTER — Ambulatory Visit (INDEPENDENT_AMBULATORY_CARE_PROVIDER_SITE_OTHER): Payer: Medicare Other | Admitting: Internal Medicine

## 2014-04-24 ENCOUNTER — Ambulatory Visit (INDEPENDENT_AMBULATORY_CARE_PROVIDER_SITE_OTHER)
Admission: RE | Admit: 2014-04-24 | Discharge: 2014-04-24 | Disposition: A | Discharge status: Home / Self Care | Payer: Medicare Other | Source: Ambulatory Visit | Attending: Internal Medicine | Admitting: Internal Medicine

## 2014-04-24 VITALS — BP 118/70 | HR 91 | Ht 68.5 in | Wt 176.6 lb

## 2014-04-24 DIAGNOSIS — J438 Other emphysema: Secondary | ICD-10-CM

## 2014-04-24 DIAGNOSIS — J439 Emphysema, unspecified: Secondary | ICD-10-CM

## 2014-04-24 MED ORDER — IPRATROPIUM-ALBUTEROL 20-100 MCG/ACT IN AERS: 1.0000 | INHALATION_SPRAY | Freq: Four times a day (QID) | RESPIRATORY_TRACT | Status: DC | PRN

## 2014-04-24 NOTE — Progress Notes (Signed)
Patient ID: Gerald [name], male    DOB: 04-30-1946, 68 y.o.   MRN: [id number]  HPI 12/26/10- 82 yoM with wife,. Former smoker followed by Dr Ginette Otto, and coming today on referral from a hospitalist at [facility]. He was dc'd from [place] 2 weeks ago after 3 day stay for acute exacerbation of COPD. Those summaries reviewed.  Current flare began a month ago. He denies routine cough or phlegm at baseline. DOE across parking lot. Still golfs with a cart. Long time smoker. . Sees Pulmonologist Dr Marlowe Sax in [place], but wanted to be seen here. Dx'd w/ COPD in 2010. FEV1 was 0.28%. He now feels he is back to baseline and says it is the best he has been in a year. An ONOX has been scheduled with a local DME company.  CXR 12/13/10- RLL infiltrate suspicious for atelectasis/ pneumonitis with f/u recommended. COPD, probable scarring.  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 Her has been using his inhaler very freuently and we discussed guidelines and alernatives.  02/05/11- COPD, CAD, wife here Since last here has started pulmonary rehab at [facility] and he feels it is helping. Noticing trace ankle edema in recent days.  Feeling a little more bloated and BP goes up with exercise. Continues sleeping with O2 at 2 L/ [facility]. Nebulizer helps clear morning mucus.   06/15/11- 68 year old male former smoker followed for COPD, CAD, .............[name] wife here He completed pulmonary rehabilitation program at [facility] and says it really didn't help him. He is trying to walk more on his own but has not joined any formal program for exercise. He went to the emergency room September 13 with a COPD exacerbation but denies obvious cold or infection at that time. Chest x-ray there was stable with no new process. He was treated with a steroid shot of antibiotic. Comments on some increase in weight and abdominal girth. Denies palpitation tachycardia or ankle  edema. He often uses his Combivent inhaler several times a day. Using his nebulizer machine with Proformist and budesonide twice every day. Sleeps on oxygen at 2 L.  He does gradually got more congested with increasing phlegm. Is now coughing again over the last few days with throat tickle. We discussed and are going to try again with Daliresp as a nonsteroidal anti-inflammatory.   10/21/11- 68 year old male former smoker followed for COPD, CAD, .............[name] wife here C/O: "breathing is fair"; producing more phlegm-no color; wheezing, SOB from time to time; Uses Combivent everyday up to 6 times a day(has used 4 times during the night). He is using up a Combivent units every 2 weeks. He stopped maintenance controller medications, Advair then Symbicort. He feels overall better controlled now with no acute flare. Daliresp seems to help. For the past 2 weeks has been coughing more clear mucus. His wife thinks he is on a downward spiral towards another acute exacerbation. He starts out at night in bed, wearing oxygen. Usually moves to his recliner at sometime during the night and does not take the oxygen with him. Using nebulizer with Perforomist / Budesonide twice daily and rarely uses DuoNeb.  04/20/12-  68 year old male former smoker followed for COPD, CAD...............[name] wife here COPD-Breathing well(has been on Prednisone for 30 days now)before then was using combivent inhaler too much. Only using about 3-4 times if needed only. Has supplemented with duoneb nebulizer once to twice daily-some days not having to use inhaler or neb at all. We started  daily pred 10 mg maintenance July 11. He was more short of breath with a bronchitis in early summer. We called in prednisone and then which was a big help. He still wheezes now morning cough but he rarely hears his own wheeze. Uses neb- either Perforomist/ budesonide or Duoneb, up to 4 times daily. Carries Combivent. We discussed his ipratropium. Has some history  of BPH but tolerating meds without urinary retention. Continues oxygen for sleep at 2 L/[facility] in [place].  COPD assessment test score 15/40.  10/19/12- 68 year old male former smoker followed for COPD, CAD...............[name] wife here FOLLOWS FOR: No increase in SOB or wheezing since last visit. Good winter "fine".  ONOX 06/15/12- still qualifies for O2 sleep- 10 minutes< 89% on room air. Often takes O2 off during night - cannula is "annoying". Discussed. Continues prednisone 5 mg daily, reduced by him from 10/ 5 qod after last ov here. Pred talk.  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  Original Report Authenticated By: Larey Seat, M.D.  04/19/13- 10/19/12- 68 year old male former smoker followed for COPD, CAD...............[name] wife here 6 month followup, has questions regarding medications Using nebulizer 3 or 4 times daily. Continues prednisone 2.5 mg daily. Interested in trying [employer] from [place] ad. We discussed his lisinopril again. He feels comfortable staying on it. Continues O2 2L sleep/ [facility] in [place].  10/25/13-   68 year old male former smoker followed for COPD, CAD...............[name] wife here FOLLOWS FOR: Pt reports breathing with some improvement--No concerns today Continues O2 2L sleep/ [facility] in [place] Patient has had a good winter with no special problems. Less dyspnea on exertion and walking more. Memory Dance was too expensive to consider as an option but current meds okay. Wife participated in a discussion of possible sleep apnea symptoms. He agreed to take a booklet. CXR 05/03/13 IMPRESSION:  COPD changes.  No acute abnormalities.  Original Report Authenticated By: Lavonia Dana, MD  04/24/14- 68 year old male former smoker followed for COPD, CAD...............[name] wife here FOLLOWS FOR: Patient denies any flare ups no more than usual.  He is very pleased with his status. Little productive cough. Medicines are "perfect". He  has no problems from lisinopril. Daily prednisone 2.5 mg and aspirin 81 mg associated with easy bruising.  Review of Systems- See HPI Constitutional:   No weight loss, night sweats,  Fevers, chills, fatigue, lassitude. HEENT:   No headaches,  Difficulty swallowing,  Tooth/dental problems,  Sore throat,                No sneezing, itching, ear ache, nasal congestion, post nasal drip,  CV:  No chest pain,  Orthopnea, PND, , anasarca, dizziness, palpitations GI  No heartburn, indigestion, abdominal pain, nausea, vomiting, diarrhea, change in bowel habits, loss                 of appetite Resp:.  No excess mucus, No- productive cough,  No coughing up of blood.  No change in color of                  mucus.  No wheezing. Skin: no rash or lesions. GU: . MS:  No joint pain or swelling.  No decreased range of motion.  Psych:  No change in mood or affect. No depression or anxiety.  No memory loss.   Objective:   Physical Exam General- Alert, Oriented, Affect-appropriate, Distress- none acute, talkative, overweight Skin- rash-none, lesions- none, excoriation- none Lymphadenopathy- none Head- atraumatic  Eyes- Gross vision intact, PERRLA, conjunctivae clear secretions            Ears- Hearing aids            Nose- Clear, no-Septal dev, mucus, polyps, erosion, perforation             Throat- Mallampati II , mucosa clear , drainage- none, tonsils- atrophic Neck- flexible , trachea midline, no stridor , thyroid nl, carotid no bruit Chest - symmetrical excursion , unlabored           Heart/CV- RRR , no murmur , no gallop  , no rub, nl s1 s2                           - JVD- none , edema- none, stasis changes- none, varices- none           Lung- +distant , unlabored, wheeze+trace, cough- none , dullness-none, rub- none           Chest wall-  Abd-  Br/ Gen/ Rectal- Not done, not indicated Extrem- cyanosis- none, clubbing, none, atrophy- none, strength- nl Neuro- grossly intact to  observation

## 2014-04-24 NOTE — Patient Instructions (Addendum)
Refilled script for Combivent inhaler  Order- CXR dx COPD with emphysema

## 2014-08-08 ENCOUNTER — Other Ambulatory Visit: Payer: Self-pay | Admitting: Internal Medicine

## 2014-08-13 ENCOUNTER — Other Ambulatory Visit: Payer: Self-pay | Admitting: Internal Medicine

## 2014-08-14 NOTE — Telephone Encounter (Signed)
Ok to refill prednisone

## 2014-08-14 NOTE — Telephone Encounter (Signed)
CY please advise on refill; sig's do not match from our records and Rx refill being requested. Thanks.

## 2014-08-14 NOTE — Telephone Encounter (Signed)
Plan was to use prednisone 5 Mg, # 60 trying for 1 alternating with 1/2 tab, every other day

## 2014-08-20 ENCOUNTER — Telehealth: Payer: Self-pay | Admitting: Internal Medicine

## 2014-08-20 ENCOUNTER — Encounter: Payer: Self-pay | Admitting: Adult Health

## 2014-08-20 ENCOUNTER — Ambulatory Visit (INDEPENDENT_AMBULATORY_CARE_PROVIDER_SITE_OTHER): Payer: Medicare Other | Admitting: Adult Health

## 2014-08-20 VITALS — BP 98/62 | HR 97 | Temp 97.8°F | Ht 68.5 in | Wt 177.2 lb

## 2014-08-20 DIAGNOSIS — J439 Emphysema, unspecified: Secondary | ICD-10-CM

## 2014-08-20 MED ORDER — AZITHROMYCIN 250 MG PO TABS: ORAL_TABLET | ORAL | Status: AC

## 2014-08-20 MED ORDER — CLOTRIMAZOLE 10 MG MT TROC: 10.0000 mg | Freq: Every day | OROMUCOSAL | Status: DC

## 2014-08-20 NOTE — Telephone Encounter (Signed)
Pt went to [place] over weekend and told to follow up here asap. Appt scheduled with TP today. Pt aware. Nothing more needed at this time.

## 2014-08-20 NOTE — Patient Instructions (Signed)
Zpack take as directed  Mucinex DM Twice daily  As needed  Cough/congestion  Mycelex troche five daily for 1 week for thrush.  Brush/rinse and gargle after Nebs.  Please contact office for sooner follow up if symptoms do not improve or worsen or seek emergency care  Follow up Dr. Annamaria Boots  As planned and As needed   If cough persists , discuss with family doctor about changing Lisinopril as it may be causing your cough to be worse.

## 2014-08-20 NOTE — Progress Notes (Signed)
Patient ID: Gerald [name], male    DOB: 12-Apr-1946, 68 y.o.   MRN: [id number]  HPI 12/26/10- 45 yoM with wife,. Former smoker followed by Dr Ginette Otto, and coming today on referral from a hospitalist at [facility]. He was dc'd from [place] 2 weeks ago after 3 day stay for acute exacerbation of COPD. Those summaries reviewed.  Current flare began a month ago. He denies routine cough or phlegm at baseline. DOE across parking lot. Still golfs with a cart. Long time smoker. . Sees Pulmonologist Dr Marlowe Sax in [place], but wanted to be seen here. Dx'd w/ COPD in 2010. FEV1 was 0.28%. He now feels he is back to baseline and says it is the best he has been in a year. An ONOX has been scheduled with a local DME company.  CXR 12/13/10- RLL infiltrate suspicious for atelectasis/ pneumonitis with f/u recommended. COPD, probable scarring.  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 Her has been using his inhaler very freuently and we discussed guidelines and alernatives.  02/05/11- COPD, CAD, wife here Since last here has started pulmonary rehab at [facility] and he feels it is helping. Noticing trace ankle edema in recent days.  Feeling a little more bloated and BP goes up with exercise. Continues sleeping with O2 at 2 L/ [facility]. Nebulizer helps clear morning mucus.   06/15/11- 68 year old male former smoker followed for COPD, CAD, .............[name] wife here He completed pulmonary rehabilitation program at [facility] and says it really didn't help him. He is trying to walk more on his own but has not joined any formal program for exercise. He went to the emergency room September 13 with a COPD exacerbation but denies obvious cold or infection at that time. Chest x-ray there was stable with no new process. He was treated with a steroid shot of antibiotic. Comments on some increase in weight and abdominal girth. Denies palpitation tachycardia or ankle  edema. He often uses his Combivent inhaler several times a day. Using his nebulizer machine with Proformist and budesonide twice every day. Sleeps on oxygen at 2 L.  He does gradually got more congested with increasing phlegm. Is now coughing again over the last few days with throat tickle. We discussed and are going to try again with Daliresp as a nonsteroidal anti-inflammatory.   10/21/11- 68 year old male former smoker followed for COPD, CAD, .............[name] wife here C/O: "breathing is fair"; producing more phlegm-no color; wheezing, SOB from time to time; Uses Combivent everyday up to 6 times a day(has used 4 times during the night). He is using up a Combivent units every 2 weeks. He stopped maintenance controller medications, Advair then Symbicort. He feels overall better controlled now with no acute flare. Daliresp seems to help. For the past 2 weeks has been coughing more clear mucus. His wife thinks he is on a downward spiral towards another acute exacerbation. He starts out at night in bed, wearing oxygen. Usually moves to his recliner at sometime during the night and does not take the oxygen with him. Using nebulizer with Perforomist / Budesonide twice daily and rarely uses DuoNeb.  04/20/12-  68 year old male former smoker followed for COPD, CAD...............[name] wife here COPD-Breathing well(has been on Prednisone for 30 days now)before then was using combivent inhaler too much. Only using about 3-4 times if needed only. Has supplemented with duoneb nebulizer once to twice daily-some days not having to use inhaler or neb at all. We started  daily pred 10 mg maintenance July 11. He was more short of breath with a bronchitis in early summer. We called in prednisone and then which was a big help. He still wheezes now morning cough but he rarely hears his own wheeze. Uses neb- either Perforomist/ budesonide or Duoneb, up to 4 times daily. Carries Combivent. We discussed his ipratropium. Has some history  of BPH but tolerating meds without urinary retention. Continues oxygen for sleep at 2 L/[facility] in [place].  COPD assessment test score 15/40.  10/19/12- 68 year old male former smoker followed for COPD, CAD...............[name] wife here FOLLOWS FOR: No increase in SOB or wheezing since last visit. Good winter "fine".  ONOX 06/15/12- still qualifies for O2 sleep- 10 minutes< 89% on room air. Often takes O2 off during night - cannula is "annoying". Discussed. Continues prednisone 5 mg daily, reduced by him from 10/ 5 qod after last ov here. Pred talk.  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  Original Report Authenticated By: Larey Seat, M.D.  04/19/13- 10/19/12- 68 year old male former smoker followed for COPD, CAD...............[name] wife here 6 month followup, has questions regarding medications Using nebulizer 3 or 4 times daily. Continues prednisone 2.5 mg daily. Interested in trying Kellogg from [place] ad. We discussed his lisinopril again. He feels comfortable staying on it. Continues O2 2L sleep/ [facility] in [place].  10/25/13-   68 year old male former smoker followed for COPD, CAD...............[name] wife here FOLLOWS FOR: Pt reports breathing with some improvement--No concerns today Continues O2 2L sleep/ [facility] in [place] Patient has had a good winter with no special problems. Less dyspnea on exertion and walking more. Memory Dance was too expensive to consider as an option but current meds okay. Wife participated in a discussion of possible sleep apnea symptoms. He agreed to take a booklet. CXR 05/03/13 IMPRESSION:  COPD changes.  No acute abnormalities.  Original Report Authenticated By: Lavonia Dana, MD  04/24/14- 68 year old male former smoker followed for COPD, CAD...............[name] wife here FOLLOWS FOR: Patient denies any flare ups no more than usual.   08/20/2014 Acute OV  Complains of wheezing for last 3 days . Went to urgent care, they  gave him a shot of depomedrol 80mg .   Starting coughing up thick yellow mucus this morning. Hard time breathing with chest congestion. On pulmicort and perforomist neb Twice daily   On ACE for at least 2 years. Has daily cough.  Denies chest pain, orthopnea, fever, edema or n/v/d.    Review of Systems- See HPI Constitutional:   No weight loss, night sweats,  Fevers, chills, fatigue, lassitude. HEENT:   No headaches,  Difficulty swallowing,  Tooth/dental problems,  Sore throat,                No sneezing, itching, ear ache,  +nasal congestion, post nasal drip,  CV:  No chest pain,  Orthopnea, PND, , anasarca, dizziness, palpitations GI  No heartburn, indigestion, abdominal pain, nausea, vomiting, diarrhea, change in bowel habits, loss                 of appetite Resp:.   No change in color of                  mucus.  No wheezing. Skin: no rash or lesions. GU: . MS:  No joint pain or swelling.  No decreased range of motion.  Psych:  No change in mood or affect. No depression or anxiety.  No memory loss.  Objective:   Physical Exam General- Alert, Oriented, Affect-appropriate, Distress- none acute,  overweight Skin- rash-none, lesions- none, excoriation- none Lymphadenopathy- none Head- atraumatic            Eyes- Gross vision intact, PERRLA, conjunctivae clear secretions            Ears- Hearing aids            Nose- Clear, no-Septal dev, mucus, polyps, erosion, perforation             Throat- Mallampati II , mucosa clear , drainage- none, tonsils- atrophic, white patches along post pharynx  Neck- flexible , trachea midline, no stridor , thyroid nl, carotid no bruit Chest - symmetrical excursion , unlabored           Heart/CV- RRR , no murmur , no gallop  , no rub, nl s1 s2                           - JVD- none , edema- none, stasis changes- none, varices- none           Lung- +distant , unlabored, wheeze+trace, cough- none , dullness-none, rub- none           Chest wall-  Abd-   Br/ Gen/ Rectal- Not done, not indicated Extrem- cyanosis- none, clubbing, none, atrophy- none, strength- nl Neuro- grossly intact to observation

## 2014-08-20 NOTE — Assessment & Plan Note (Signed)
Slow to resolve flare  Oral candidiasis noted on exam   Plan  Zpack take as directed  Mucinex DM Twice daily  As needed  Cough/congestion  Mycelex troche five daily for 1 week for thrush.  Brush/rinse and gargle after Nebs.  Please contact office for sooner follow up if symptoms do not improve or worsen or seek emergency care  Follow up Dr. Annamaria Boots  As planned and As needed   If cough persists , discuss with family doctor about changing Lisinopril as it may be causing your cough to be worse.

## 2014-08-31 ENCOUNTER — Telehealth: Payer: Self-pay | Admitting: Internal Medicine

## 2014-08-31 MED ORDER — FORMOTEROL FUMARATE 20 MCG/2ML IN NEBU: 20.0000 ug | INHALATION_SOLUTION | Freq: Two times a day (BID) | RESPIRATORY_TRACT | Status: DC

## 2014-08-31 MED ORDER — BUDESONIDE 0.5 MG/2ML IN SUSP: 0.5000 mg | Freq: Two times a day (BID) | RESPIRATORY_TRACT | Status: AC

## 2014-08-31 MED ORDER — IPRATROPIUM-ALBUTEROL 0.5-2.5 (3) MG/3ML IN SOLN: 3.0000 mL | RESPIRATORY_TRACT | Status: DC | PRN

## 2014-08-31 NOTE — Telephone Encounter (Signed)
Spoke with the pt to verify refills needed  Rxs faxed to [facility] for performist, pulmiocort and duoneb

## 2014-09-02 NOTE — Assessment & Plan Note (Signed)
Well-controlled using low-dose maintenance prednisone. We discussed steroid side effects again. Plan-try reducing prednisone to every other day 2.5 mg

## 2014-09-10 IMAGING — CR DG CHEST 2V
2 series · 2 of 2 positions shown · non-contrast
Comparison: 05/01/2013 and 04/20/2012

CLINICAL DATA: COPD/ emphysema.

EXAM:
CHEST  2 VIEW

[view not recorded (1 of 2)]
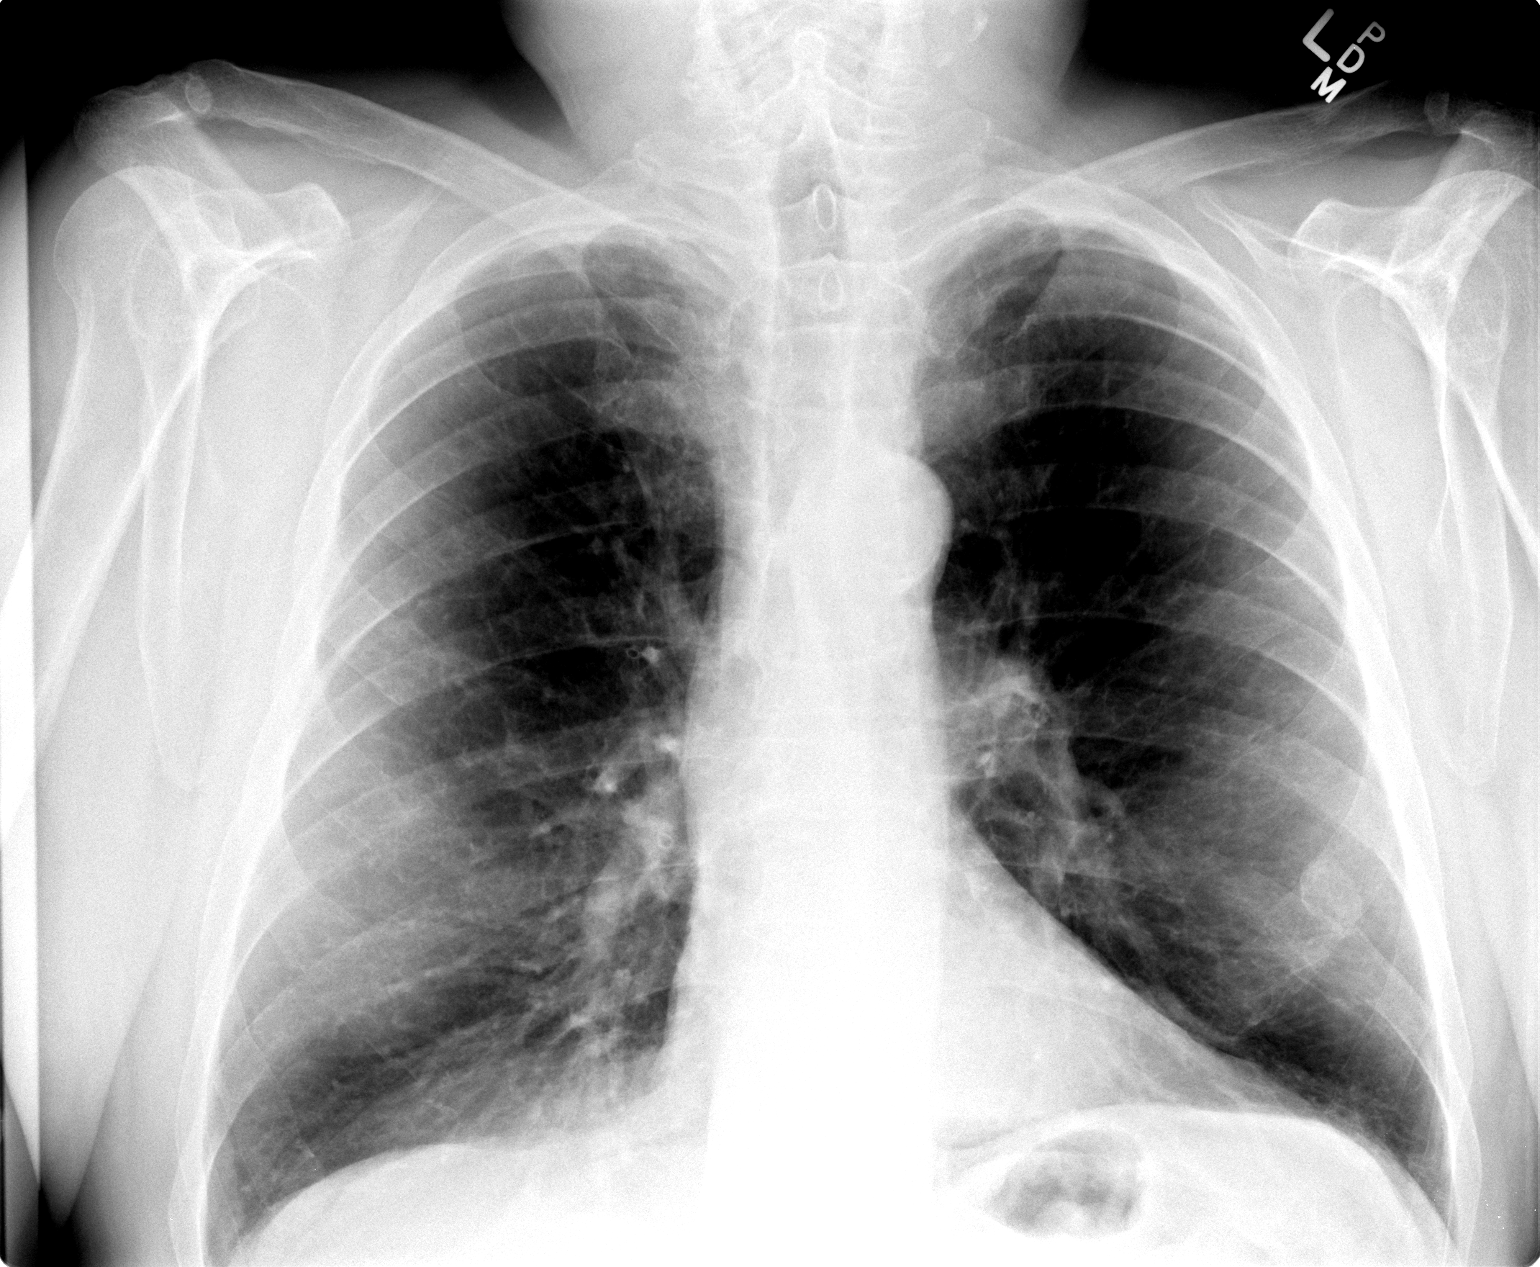

[view not recorded (2 of 2)]
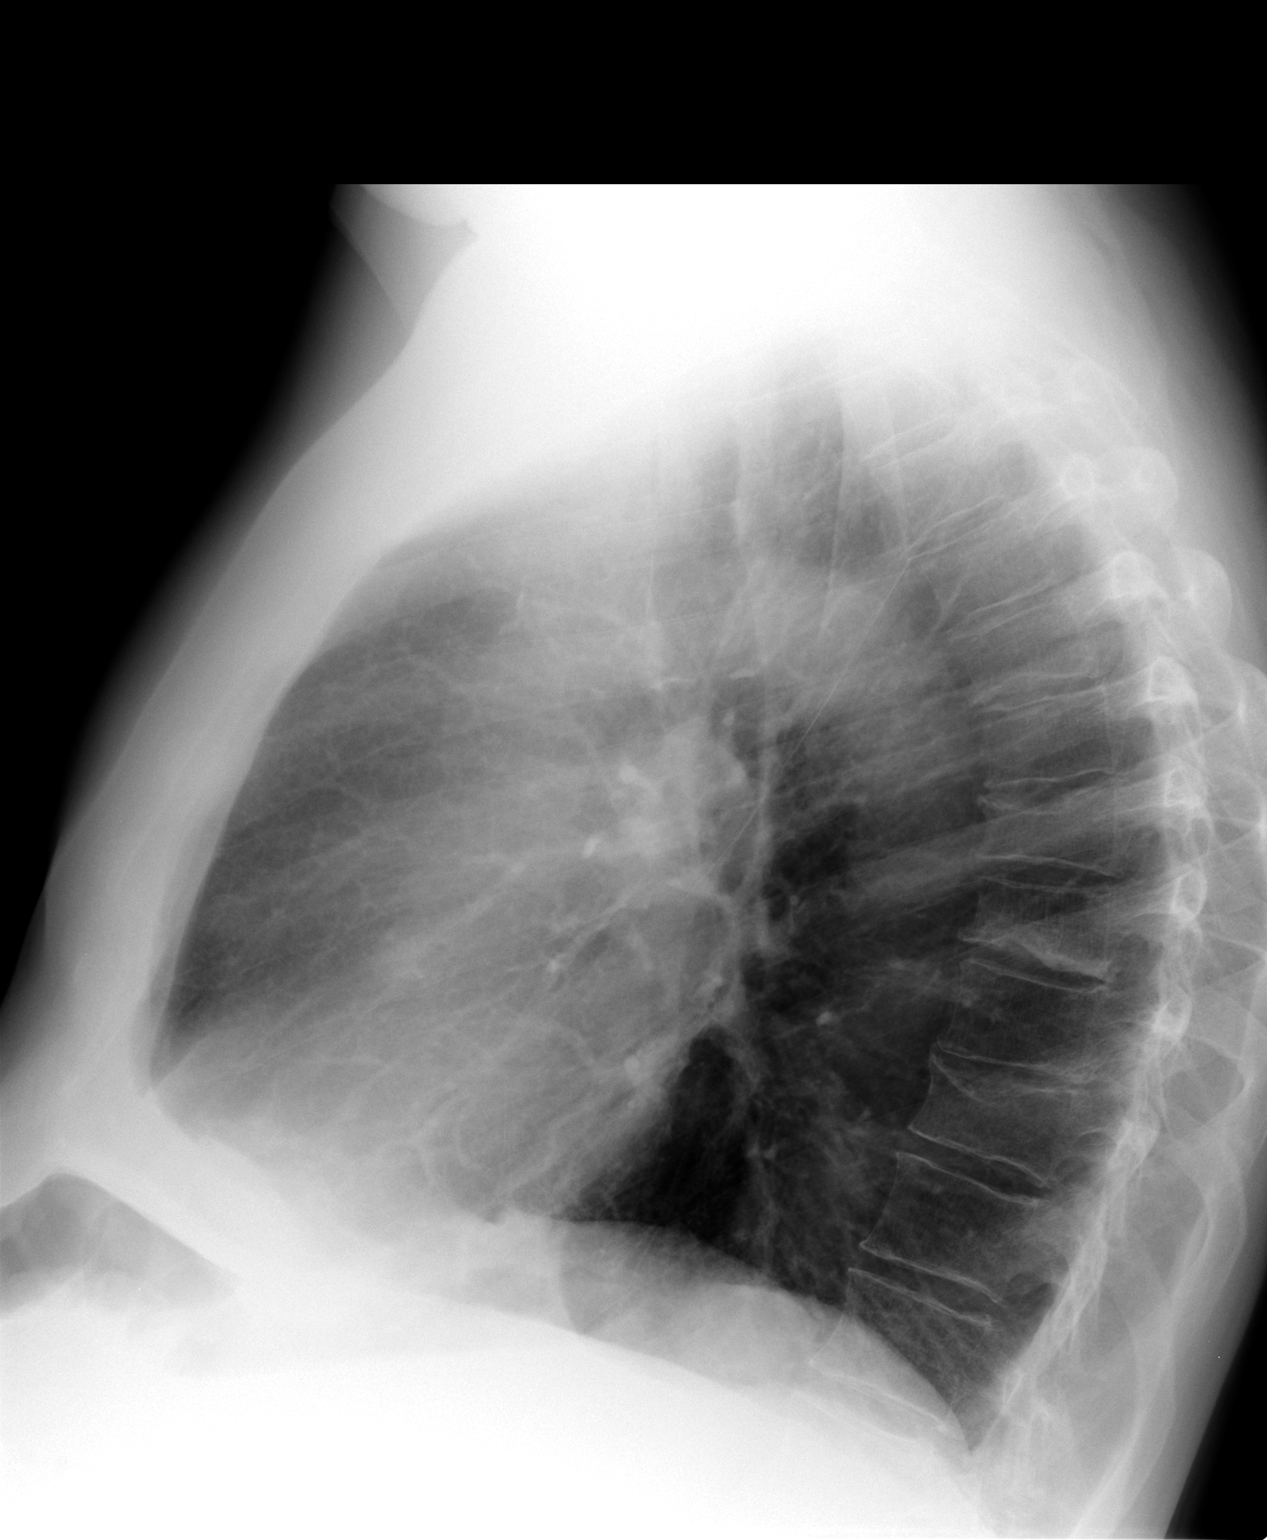

[2 of 2 positions shown; findings below may reference images not displayed]

FINDINGS: Lungs are adequately inflated with flattening of the hemidiaphragms.
There is mild density over the medial right base unchanged.
Cardiomediastinal silhouette is within normal. There is minimal
calcified plaque involving the thoracic aorta. There are several
mild compression fractures of the thoracic spine unchanged.
IMPRESSION: No active cardiopulmonary disease.

Emphysematous disease.

Several stable mild compression fractures of the thoracic spine.

## 2014-10-15 ENCOUNTER — Other Ambulatory Visit: Payer: Self-pay | Admitting: Internal Medicine

## 2014-10-24 ENCOUNTER — Ambulatory Visit: Payer: Medicare Other | Admitting: Internal Medicine

## 2014-12-12 ENCOUNTER — Ambulatory Visit (INDEPENDENT_AMBULATORY_CARE_PROVIDER_SITE_OTHER): Payer: Medicare Other | Admitting: Internal Medicine

## 2014-12-12 ENCOUNTER — Encounter: Payer: Self-pay | Admitting: Internal Medicine

## 2014-12-12 VITALS — BP 128/72 | HR 102 | Ht 68.5 in | Wt 176.4 lb

## 2014-12-12 DIAGNOSIS — G4700 Insomnia, unspecified: Secondary | ICD-10-CM

## 2014-12-12 DIAGNOSIS — J432 Centrilobular emphysema: Secondary | ICD-10-CM

## 2014-12-12 MED ORDER — TIOTROPIUM BROMIDE-OLODATEROL 2.5-2.5 MCG/ACT IN AERS: 2.0000 | INHALATION_SPRAY | Freq: Every day | RESPIRATORY_TRACT | Status: DC

## 2014-12-12 MED ORDER — SUVOREXANT 15 MG PO TABS: 1.0000 | ORAL_TABLET | Freq: Every day | ORAL | Status: DC

## 2014-12-12 MED ORDER — IPRATROPIUM-ALBUTEROL 20-100 MCG/ACT IN AERS: 1.0000 | INHALATION_SPRAY | Freq: Four times a day (QID) | RESPIRATORY_TRACT | Status: DC | PRN

## 2014-12-12 NOTE — Patient Instructions (Signed)
Try increasing prednisone to 2.5 mg or 5 mg, once daily  Samples  Belsomra 15 mg at bedtime for sleep  Sample Stiolto Respimat   2 puffs, once daily

## 2014-12-12 NOTE — Progress Notes (Signed)
Patient ID: Gerald [name], male    DOB: 1946-01-28, 69 y.o.   MRN: [id number]  HPI 12/26/10- 30 yoM with wife,. Former smoker followed by Dr Ginette Otto, and coming today on referral from a hospitalist at [facility]. He was dc'd from [place] 2 weeks ago after 3 day stay for acute exacerbation of COPD. Those summaries reviewed.  Current flare began a month ago. He denies routine cough or phlegm at baseline. DOE across parking lot. Still golfs with a cart. Long time smoker. . Sees Pulmonologist Dr Marlowe Sax in [place], but wanted to be seen here. Dx'd w/ COPD in 2010. FEV1 was 0.28%. He now feels he is back to baseline and says it is the best he has been in a year. An ONOX has been scheduled with a local DME company.  CXR 12/13/10- RLL infiltrate suspicious for atelectasis/ pneumonitis with f/u recommended. COPD, probable scarring.  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 Her has been using his inhaler very freuently and we discussed guidelines and alernatives.  02/05/11- COPD, CAD, wife here Since last here has started pulmonary rehab at [facility] and he feels it is helping. Noticing trace ankle edema in recent days.  Feeling a little more bloated and BP goes up with exercise. Continues sleeping with O2 at 2 L/ [facility]. Nebulizer helps clear morning mucus.   06/15/11- 69 year old male former smoker followed for COPD, CAD, .............[name] wife here He completed pulmonary rehabilitation program at [facility] and says it really didn't help him. He is trying to walk more on his own but has not joined any formal program for exercise. He went to the emergency room September 13 with a COPD exacerbation but denies obvious cold or infection at that time. Chest x-ray there was stable with no new process. He was treated with a steroid shot of antibiotic. Comments on some increase in weight and abdominal girth. Denies palpitation tachycardia or ankle  edema. He often uses his Combivent inhaler several times a day. Using his nebulizer machine with Proformist and budesonide twice every day. Sleeps on oxygen at 2 L.  He does gradually got more congested with increasing phlegm. Is now coughing again over the last few days with throat tickle. We discussed and are going to try again with Daliresp as a nonsteroidal anti-inflammatory.   10/21/11- 69 year old male former smoker followed for COPD, CAD, .............[name] wife here C/O: "breathing is fair"; producing more phlegm-no color; wheezing, SOB from time to time; Uses Combivent everyday up to 6 times a day(has used 4 times during the night). He is using up a Combivent units every 2 weeks. He stopped maintenance controller medications, Advair then Symbicort. He feels overall better controlled now with no acute flare. Daliresp seems to help. For the past 2 weeks has been coughing more clear mucus. His wife thinks he is on a downward spiral towards another acute exacerbation. He starts out at night in bed, wearing oxygen. Usually moves to his recliner at sometime during the night and does not take the oxygen with him. Using nebulizer with Perforomist / Budesonide twice daily and rarely uses DuoNeb.  04/20/12-  69 year old male former smoker followed for COPD, CAD...............[name] wife here COPD-Breathing well(has been on Prednisone for 30 days now)before then was using combivent inhaler too much. Only using about 3-4 times if needed only. Has supplemented with duoneb nebulizer once to twice daily-some days not having to use inhaler or neb at all. We started  daily pred 10 mg maintenance July 11. He was more short of breath with a bronchitis in early summer. We called in prednisone and then which was a big help. He still wheezes now morning cough but he rarely hears his own wheeze. Uses neb- either Perforomist/ budesonide or Duoneb, up to 4 times daily. Carries Combivent. We discussed his ipratropium. Has some history  of BPH but tolerating meds without urinary retention. Continues oxygen for sleep at 2 L/[facility] in [place].  COPD assessment test score 15/40.  10/19/12- 69 year old male former smoker followed for COPD, CAD...............[name] wife here FOLLOWS FOR: No increase in SOB or wheezing since last visit. Good winter "fine".  ONOX 06/15/12- still qualifies for O2 sleep- 10 minutes< 89% on room air. Often takes O2 off during night - cannula is "annoying". Discussed. Continues prednisone 5 mg daily, reduced by him from 10/ 5 qod after last ov here. Pred talk.  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  Original Report Authenticated By: Larey Seat, M.D.  04/19/13- 10/19/12- 69 year old male former smoker followed for COPD, CAD...............[name] wife here 6 month followup, has questions regarding medications Using nebulizer 3 or 4 times daily. Continues prednisone 2.5 mg daily. Interested in trying [employer] from [place] ad. We discussed his lisinopril again. He feels comfortable staying on it. Continues O2 2L sleep/ [facility] in [place].  10/25/13-   69 year old male former smoker followed for COPD, CAD...............[name] wife here FOLLOWS FOR: Pt reports breathing with some improvement--No concerns today Continues O2 2L sleep/ [facility] in [place] Patient has had a good winter with no special problems. Less dyspnea on exertion and walking more. Memory Dance was too expensive to consider as an option but current meds okay. Wife participated in a discussion of possible sleep apnea symptoms. He agreed to take a booklet. CXR 05/03/13 IMPRESSION:  COPD changes.  No acute abnormalities.  Original Report Authenticated By: Lavonia Dana, MD  04/24/14- 69 year old male former smoker followed for COPD, CAD...............[name] wife here FOLLOWS FOR: Patient denies any flare ups no more than usual.   08/20/2014 Acute OV  Complains of wheezing for last 3 days . Went to urgent care, they  gave him a shot of depomedrol 80mg .   Starting coughing up thick yellow mucus this morning. Hard time breathing with chest congestion. On pulmicort and perforomist neb Twice daily   On ACE for at least 2 years. Has daily cough.  Denies chest pain, orthopnea, fever, edema or n/v/d.   12/12/14- 69 year old male former smoker followed for COPD, CAD...............[name] wife here FOLLOWS FOR: Continues to have wheezing, giving out with walking short distances. Refill sent for Combivent Uses portable nebulizer and/or Combivent up to 6x/ day. Using O2 2L/ sleep/ [facility] in [place] only few hours at night because not sleeping. Increased dyspnea on exertion with ADLs especially over the past 10 days or so. Feels more chest congestion some days. Discussed medications, stimulant impact on sleep quality etc. Usually sleeps with head elevated-pillows or recliner. CXR 04/24/14 IMPRESSION: No active cardiopulmonary disease. Emphysematous disease. Several stable mild compression fractures of the thoracic spine. Electronically Signed  By: Marin Olp M.D.  On: 04/24/2014 11:53   Review of Systems- See HPI Constitutional:   No weight loss, night sweats,  Fevers, chills, fatigue, lassitude. HEENT:   No headaches,  Difficulty swallowing,  Tooth/dental problems,  Sore throat,                No sneezing, itching, ear ache,  nasal congestion, post nasal drip,  CV:  No chest pain,  +Orthopnea, PND, , anasarca, dizziness, palpitations GI  No heartburn, indigestion, abdominal pain, nausea, vomiting, diarrhea,  Resp:.   No change in color of mucus.  No wheezing. Skin: no rash or lesions. GU: . MS:  No joint pain or swelling.  No decreased range of motion.  Psych:  No change in mood or affect. No depression or anxiety.  No memory loss.   Objective:   Physical Exam General- Alert, Oriented, Affect-appropriate, Distress- none acute,  overweight Skin- rash-none, lesions- none,  excoriation- none Lymphadenopathy- none Head- atraumatic            Eyes- Gross vision intact, PERRLA, conjunctivae clear secretions            Ears- Hearing aids            Nose- Clear, no-Septal dev, mucus, polyps, erosion, perforation             Throat- Mallampati II , mucosa clear , drainage- none, tonsils- atrophic, white patches along post pharynx  Neck- flexible , trachea midline, no stridor , thyroid nl, carotid no bruit Chest - symmetrical excursion , unlabored           Heart/CV- RRR , no murmur , no gallop  , no rub, nl s1 s2                           - JVD- none , edema- none, stasis changes- none, varices- none           Lung- +distant , unlabored, wheeze+trace, cough- none , dullness-none, rub- none           Chest wall-  Abd-  Br/ Gen/ Rectal- Not done, not indicated Extrem- cyanosis- none, clubbing, none, atrophy- none, strength- nl Neuro- grossly intact to observation

## 2014-12-16 ENCOUNTER — Other Ambulatory Visit: Payer: Self-pay | Admitting: Internal Medicine

## 2014-12-16 DIAGNOSIS — G4700 Insomnia, unspecified: Secondary | ICD-10-CM | POA: Insufficient documentation

## 2014-12-16 NOTE — Assessment & Plan Note (Signed)
Progression of severe COPD. I'm not sure we will find a specific exacerbation. Plan-try increasing prednisone to 2.5 mg or 5 mg daily. Sample Stiolto Respimat for trial.

## 2014-12-16 NOTE — Assessment & Plan Note (Signed)
Situational anxiety, nocturia and component of dyspnea all contribute. We discussed ways to help without magnifying sedation excessively. Plan-Try samples Belsomra

## 2014-12-17 MED ORDER — ROFLUMILAST 500 MCG PO TABS: 500.0000 ug | ORAL_TABLET | Freq: Every day | ORAL | Status: DC

## 2014-12-17 NOTE — Telephone Encounter (Signed)
Last seen 3.30.16 by CY, follow up in 6 months > pending appt 05/2015 Daliresp last refilled 11.25.15 #90 w/ 0 refills Refills sent

## 2014-12-21 ENCOUNTER — Other Ambulatory Visit: Payer: Self-pay | Admitting: Internal Medicine

## 2014-12-21 MED ORDER — TIOTROPIUM BROMIDE-OLODATEROL 2.5-2.5 MCG/ACT IN AERS: 2.0000 | INHALATION_SPRAY | Freq: Every day | RESPIRATORY_TRACT | Status: DC

## 2014-12-26 ENCOUNTER — Telehealth: Payer: Self-pay | Admitting: *Deleted

## 2014-12-26 NOTE — Telephone Encounter (Signed)
Received medication rejection from pharmacy  Anoro is the preferred drug. Will submit PA today for Stiolto Respimat.

## 2014-12-31 MED ORDER — UMECLIDINIUM-VILANTEROL 62.5-25 MCG/INH IN AEPB: 1.0000 | INHALATION_SPRAY | Freq: Every day | RESPIRATORY_TRACT | Status: DC

## 2014-12-31 NOTE — Telephone Encounter (Signed)
PA denied patient must try and fail Anoro. Spoke with Lacinda Case # [id number] Letters will be sent to provider and patient.

## 2014-12-31 NOTE — Telephone Encounter (Signed)
lmtcb x1 for pt. 

## 2014-12-31 NOTE — Telephone Encounter (Signed)
Patient notified. Rx sent to pharmacy. Nothing further needed.  

## 2014-12-31 NOTE — Telephone Encounter (Signed)
Pt returned call - cell # [phone]

## 2014-12-31 NOTE — Telephone Encounter (Signed)
We can replace Stiolto with Anoro, # 1, 1 puff once daily, refill prn

## 2015-01-09 ENCOUNTER — Telehealth: Payer: Self-pay | Admitting: Internal Medicine

## 2015-01-09 NOTE — Telephone Encounter (Signed)
Spoke with pt wife, Pt needs refill of Combivent Resp inhaler States that the pharmacy has sent multiple request without any replies from our office. Advised I would call [facility] to inquire.   Called [facility] and spoke with Barnett Applebaum - they received refill request on 12/12/14 and are needing some clinical information before this can be processed and shipped to the patient. Ref # [id number] (P) [phone] Spoke with Shanon Brow (pharmacist) - needing to check on the possible interaction between two meds (Anoro and Combivent) I advised that Combivent is to be used PRN only for SOB and Anoro is daily maintenance inhaler. Nothing further needed. [facility] is processing medication and shipping today.   Pt wife aware. Nothing further needed.

## 2015-03-22 ENCOUNTER — Telehealth: Payer: Self-pay | Admitting: Internal Medicine

## 2015-03-22 MED ORDER — UMECLIDINIUM-VILANTEROL 62.5-25 MCG/INH IN AEPB: 1.0000 | INHALATION_SPRAY | Freq: Every day | RESPIRATORY_TRACT | Status: DC

## 2015-03-22 NOTE — Telephone Encounter (Signed)
Patient has been getting Anoro from [facility] and he changed to [facility].    Patient's rx sent to [facility]. Nothing further needed.

## 2015-04-19 ENCOUNTER — Other Ambulatory Visit: Payer: Self-pay | Admitting: Internal Medicine

## 2015-04-19 NOTE — Telephone Encounter (Signed)
Pt requesting prednisone refill- '5mg'$  tabs to take alternating 1 tablet qod and 2 tablets qod. Per last ov note, pt was taking prednisone 2.5 mg qod.   Pt last seen 11/2014 , next ov 06/14/15. Dr. Annamaria Boots please advise if you're ok with this refill.  Thanks!

## 2015-04-21 NOTE — Telephone Encounter (Signed)
Ok to refill prednisone the way it was previously written, to allow larger amount. When you call him, please check. If he has been stable taking 2.5 mg every other day then ok to give permission to continue at the lower dose.

## 2015-04-26 NOTE — Telephone Encounter (Signed)
Fax for Rx came through and CY went ahead and just approved Rx refill.

## 2015-04-30 ENCOUNTER — Telehealth: Payer: Self-pay | Admitting: Internal Medicine

## 2015-04-30 MED ORDER — PREDNISONE 10 MG PO TABS: ORAL_TABLET | ORAL | Status: DC

## 2015-04-30 NOTE — Telephone Encounter (Signed)
Informed pt of recs and he agreed. Prednisone sent to pharmacy. Nothing further needed.

## 2015-04-30 NOTE — Telephone Encounter (Signed)
Prednisone 10 mg, # 20, 4 X 2 DAYS, 3 X 2 DAYS, 2 X 2 DAYS, 1 X 2 DAYS  

## 2015-04-30 NOTE — Telephone Encounter (Signed)
Pt wanting to know if appt is necessary for current symptoms.  Seen by UC 04/26/15 for AECOPD - given Cefdinir 300 BID x 10 days, Depo 80 and is not feeling any better. Pt states that he gets extremely SOB with exertion and is having chest congestion with an increased amount of clear mucus.  Pt still taking Pred 2.'5mg'$  daily and has increased to '5mg'$  today to see if this would help with his symptoms.  What dose would you like the patient to take ?   Please advise Dr Annamaria Boots. Thanks.   No Known Allergies     Medication List       This list is accurate as of: 04/30/15  2:43 PM.  Always use your most recent med list.               alendronate 70 MG tablet  Commonly known as:  FOSAMAX  Take 70 mg by mouth every 7 (seven) days.     aspirin 81 MG tablet  Take 81 mg by mouth daily.     atorvastatin 80 MG tablet  Commonly known as:  LIPITOR  Take 80 mg by mouth daily.     budesonide 0.5 MG/2ML nebulizer solution  Commonly known as:  PULMICORT  Take 2 mLs (0.5 mg total) by nebulization 2 (two) times daily.     CALCIUM 600+D 600-400 MG-UNIT per tablet  Generic drug:  Calcium Carbonate-Vitamin D  Take 1 tablet by mouth daily.     dutasteride 0.5 MG capsule  Commonly known as:  AVODART  Take 0.5 mg by mouth daily.     formoterol 20 MCG/2ML nebulizer solution  Commonly known as:  PERFOROMIST  Take 2 mLs (20 mcg total) by nebulization 2 (two) times daily. DX:  J43.9     ipratropium-albuterol 0.5-2.5 (3) MG/3ML Soln  Commonly known as:  DUONEB  TAKE 3MLS BY NEBULIZER EVERY 4 HOURS AS NEEDED     Ipratropium-Albuterol 20-100 MCG/ACT Aers respimat  Commonly known as:  COMBIVENT RESPIMAT  Inhale 1 puff into the lungs every 6 (six) hours as needed for wheezing or shortness of breath.     losartan 25 MG tablet  Commonly known as:  COZAAR  Take 1 tablet by mouth daily.     predniSONE 5 MG tablet  Commonly known as:  DELTASONE  TAKE 2 TABLETS DAILY OR ALTERNATE 1 TABLET WITH 2 TABLETS  EVERY OTHER DAY AS DIRECTED     predniSONE 2.5 MG tablet  Commonly known as:  DELTASONE  Take 2.5 mg by mouth every other day.     roflumilast 500 MCG Tabs tablet  Commonly known as:  DALIRESP  Take 1 tablet (500 mcg total) by mouth daily.     Suvorexant 15 MG Tabs  Commonly known as:  BELSOMRA  Take 1 tablet by mouth at bedtime.     Tiotropium Bromide-Olodaterol 2.5-2.5 MCG/ACT Aers  Commonly known as:  STIOLTO RESPIMAT  Inhale 2 puffs into the lungs daily.     Umeclidinium-Vilanterol 62.5-25 MCG/INH Aepb  Commonly known as:  ANORO ELLIPTA  Inhale 1 Inhaler into the lungs daily.

## 2015-06-14 ENCOUNTER — Ambulatory Visit (HOSPITAL_COMMUNITY)
Admission: RE | Admit: 2015-06-14 | Discharge: 2015-06-14 | Disposition: A | Discharge status: Home / Self Care | Payer: Medicare Other | Source: Ambulatory Visit | Attending: Internal Medicine | Admitting: Internal Medicine

## 2015-06-14 ENCOUNTER — Ambulatory Visit (INDEPENDENT_AMBULATORY_CARE_PROVIDER_SITE_OTHER): Payer: Medicare Other | Admitting: Internal Medicine

## 2015-06-14 ENCOUNTER — Encounter: Payer: Self-pay | Admitting: Internal Medicine

## 2015-06-14 VITALS — BP 128/62 | HR 85 | Ht 68.0 in | Wt 164.4 lb

## 2015-06-14 DIAGNOSIS — J432 Centrilobular emphysema: Secondary | ICD-10-CM

## 2015-06-14 DIAGNOSIS — J439 Emphysema, unspecified: Secondary | ICD-10-CM | POA: Diagnosis not present

## 2015-06-14 DIAGNOSIS — I2510 Atherosclerotic heart disease of native coronary artery without angina pectoris: Secondary | ICD-10-CM

## 2015-06-14 NOTE — Assessment & Plan Note (Signed)
Gratifying good status with symptom control. Plan-chest x-ray, continue present meds

## 2015-06-14 NOTE — Progress Notes (Signed)
Patient ID: Gerald [name], male    DOB: 06-03-1946, 69 y.o.   MRN: [id number]  HPI 12/26/10- 79 yoM with wife,. Former smoker followed by Dr Ginette Otto, and coming today on referral from a hospitalist at [facility]. He was dc'd from [facility] 2 weeks ago after 3 day stay for acute exacerbation of COPD. Those summaries reviewed.  Current flare began a month ago. He denies routine cough or phlegm at baseline. DOE across parking lot. Still golfs with a cart. Long time smoker. . Sees Pulmonologist Dr Marlowe Sax in [place], but wanted to be seen here. Dx'd w/ COPD in 2010. FEV1 was 0.28%. He now feels he is back to baseline and says it is the best he has been in a year. An ONOX has been scheduled with a local DME company.  CXR 12/13/10- RLL infiltrate suspicious for atelectasis/ pneumonitis with f/u recommended. COPD, probable scarring.  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 Her has been using his inhaler very freuently and we discussed guidelines and alernatives.  02/05/11- COPD, CAD, wife here Since last here has started pulmonary rehab at [facility] and he feels it is helping. Noticing trace ankle edema in recent days.  Feeling a little more bloated and BP goes up with exercise. Continues sleeping with O2 at 2 L/ [facility]. Nebulizer helps clear morning mucus.   06/15/11- 69 year old male former smoker followed for COPD, CAD, .............[name] wife here He completed pulmonary rehabilitation program at [facility] and says it really didn't help him. He is trying to walk more on his own but has not joined any formal program for exercise. He went to the emergency room September 13 with a COPD exacerbation but denies obvious cold or infection at that time. Chest x-ray there was stable with no new process. He was treated with a steroid shot of antibiotic. Comments on some increase in weight and abdominal girth. Denies palpitation tachycardia or ankle  edema. He often uses his Combivent inhaler several times a day. Using his nebulizer machine with Proformist and budesonide twice every day. Sleeps on oxygen at 2 L.  He does gradually got more congested with increasing phlegm. Is now coughing again over the last few days with throat tickle. We discussed and are going to try again with Daliresp as a nonsteroidal anti-inflammatory.   10/21/11- 69 year old male former smoker followed for COPD, CAD, .............[name] wife here C/O: "breathing is fair"; producing more phlegm-no color; wheezing, SOB from time to time; Uses Combivent everyday up to 6 times a day(has used 4 times during the night). He is using up a Combivent units every 2 weeks. He stopped maintenance controller medications, Advair then Symbicort. He feels overall better controlled now with no acute flare. Daliresp seems to help. For the past 2 weeks has been coughing more clear mucus. His wife thinks he is on a downward spiral towards another acute exacerbation. He starts out at night in bed, wearing oxygen. Usually moves to his recliner at sometime during the night and does not take the oxygen with him. Using nebulizer with Perforomist / Budesonide twice daily and rarely uses DuoNeb.  04/20/12-  69 year old male former smoker followed for COPD, CAD...............[name] wife here COPD-Breathing well(has been on Prednisone for 30 days now)before then was using combivent inhaler too much. Only using about 3-4 times if needed only. Has supplemented with duoneb nebulizer once to twice daily-some days not having to use inhaler or neb at all. We started  daily pred 10 mg maintenance July 11. He was more short of breath with a bronchitis in early summer. We called in prednisone and then which was a big help. He still wheezes now morning cough but he rarely hears his own wheeze. Uses neb- either Perforomist/ budesonide or Duoneb, up to 4 times daily. Carries Combivent. We discussed his ipratropium. Has some history  of BPH but tolerating meds without urinary retention. Continues oxygen for sleep at 2 L/[facility] in [place].  COPD assessment test score 15/40.  10/19/12- 69 year old male former smoker followed for COPD, CAD...............[name] wife here FOLLOWS FOR: No increase in SOB or wheezing since last visit. Good winter "fine".  ONOX 06/15/12- still qualifies for O2 sleep- 10 minutes< 89% on room air. Often takes O2 off during night - cannula is "annoying". Discussed. Continues prednisone 5 mg daily, reduced by him from 10/ 5 qod after last ov here. Pred talk.  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  Original Report Authenticated By: Larey Seat, M.D.  04/19/13- 10/19/12- 69 year old male former smoker followed for COPD, CAD...............[name] wife here 6 month followup, has questions regarding medications Using nebulizer 3 or 4 times daily. Continues prednisone 2.5 mg daily. Interested in trying [employer] from [place] ad. We discussed his lisinopril again. He feels comfortable staying on it. Continues O2 2L sleep/ [facility] in [place].  10/25/13-   69 year old male former smoker followed for COPD, CAD...............[name] wife here FOLLOWS FOR: Pt reports breathing with some improvement--No concerns today Continues O2 2L sleep/ [facility] in [place] Patient has had a good winter with no special problems. Less dyspnea on exertion and walking more. [facility] was too expensive to consider as an option but current meds okay. Wife participated in a discussion of possible sleep apnea symptoms. He agreed to take a booklet. CXR 05/03/13 IMPRESSION:  COPD changes.  No acute abnormalities.  Original Report Authenticated By: Lavonia Dana, MD  04/24/14- 69 year old male former smoker followed for COPD, CAD...............[name] wife here FOLLOWS FOR: Patient denies any flare ups no more than usual.   08/20/2014 Acute OV  Complains of wheezing for last 3 days . Went to urgent care, they  gave him a shot of depomedrol '80mg'$ .   Starting coughing up thick yellow mucus this morning. Hard time breathing with chest congestion. On pulmicort and perforomist neb Twice daily   On ACE for at least 2 years. Has daily cough.  Denies chest pain, orthopnea, fever, edema or n/v/d.   12/12/14- 69 year old male former smoker followed for COPD, CAD...............[name] wife here FOLLOWS FOR: Continues to have wheezing, giving out with walking short distances. Refill sent for Combivent Uses portable nebulizer and/or Combivent up to 6x/ day. Using O2 2L/ sleep/ [facility] in [place] only few hours at night because not sleeping. Increased dyspnea on exertion with ADLs especially over the past 10 days or so. Feels more chest congestion some days. Discussed medications, stimulant impact on sleep quality etc. Usually sleeps with head elevated-pillows or recliner. CXR 04/24/14 IMPRESSION: No active cardiopulmonary disease. Emphysematous disease. Several stable mild compression fractures of the thoracic spine. Electronically Signed  By: Marin Olp M.D.  On: 04/24/2014 11:53   06/14/15- 69 year old male former smoker followed for COPD, CAD.                                         FOLLOWS UP: pt. states breathing is  doing well no SOB occas. Wheezing. Has had flu vaccine Very enthusiastic about his health recently. Says he feels the best in a year. Started to play golf again. Continues maintenance prednisone 2.5 mg, Anoro, using Combivent rescue inhaler only once or twice daily. Needed prednisone taper in mid summer for bronchitis exacerbation.  Review of Systems- See HPI Constitutional:   No weight loss, night sweats,  Fevers, chills, fatigue, lassitude. HEENT:   No headaches,  Difficulty swallowing,  Tooth/dental problems,  Sore throat,                No sneezing, itching, ear ache,                 nasal congestion, post nasal drip,  CV:  No chest pain,  +Orthopnea, PND, , anasarca,  dizziness, palpitations GI  No heartburn, indigestion, abdominal pain, nausea, vomiting, diarrhea,  Resp:.   No change in color of mucus.  No wheezing. Skin: no rash or lesions. GU: . MS:  No joint pain or swelling.  No decreased range of motion.  Psych:  No change in mood or affect. No depression or anxiety.  No memory loss.   Objective:   Physical Exam General- Alert, Oriented, Affect-appropriate, Distress- none acute,  overweight Skin- rash-none, lesions- none, excoriation- none Lymphadenopathy- none Head- atraumatic            Eyes- Gross vision intact, PERRLA, conjunctivae clear secretions            Ears- Hearing aids            Nose- Clear, no-Septal dev, mucus, polyps, erosion, perforation             Throat- Mallampati II , mucosa clear , drainage- none, tonsils- atrophic, white patches along post pharynx  Neck- flexible , trachea midline, no stridor , thyroid nl, carotid no bruit Chest - symmetrical excursion , unlabored           Heart/CV- RRR , no murmur , no gallop  , no rub, nl s1 s2                           - JVD- none , edema- none, stasis changes- none, varices- none           Lung- +distant , unlabored, wheeze-none, cough- none , dullness-none, rub- none           Chest wall-  Abd-  Br/ Gen/ Rectal- Not done, not indicated Extrem- cyanosis- none, clubbing, none, atrophy- none, strength- nl Neuro- grossly intact to observation

## 2015-06-14 NOTE — Patient Instructions (Signed)
Order- CXR- centrilobular emphysema  We can continue present meds  Please call as needed

## 2015-06-14 NOTE — Assessment & Plan Note (Signed)
Watching for symptoms suggestive of coronary insufficiency or arrhythmia in this man because of his very significant smoking history.

## 2015-11-02 ENCOUNTER — Other Ambulatory Visit: Payer: Self-pay | Admitting: Internal Medicine

## 2015-11-05 ENCOUNTER — Telehealth: Payer: Self-pay | Admitting: Internal Medicine

## 2015-11-05 NOTE — Telephone Encounter (Signed)
Called # below and was transferred to pt VM--LMTCB x1

## 2015-11-05 NOTE — Telephone Encounter (Signed)
Pt returning call and can be reached @ [phone].Hillery Hunter

## 2015-11-05 NOTE — Telephone Encounter (Signed)
Patient Returned call [phone] If call after 5 please call [phone]

## 2015-11-05 NOTE — Telephone Encounter (Signed)
Spoke with the pt  He states copay on his Daliresp has increased from 50 to 700$ Ins advised that other alternatives are Striverdi, Qvar, Theophylline or Tudorza  Please advise thanks!  No Known Allergies Current Outpatient Prescriptions on File Prior to Visit  Medication Sig Dispense Refill  . alendronate (FOSAMAX) 70 MG tablet Take 70 mg by mouth every 7 (seven) days.     [name] aspirin 81 MG tablet Take 81 mg by mouth daily.     [name] atorvastatin (LIPITOR) 80 MG tablet Take 80 mg by mouth daily.      . budesonide (PULMICORT) 0.5 MG/2ML nebulizer solution Take 2 mLs (0.5 mg total) by nebulization 2 (two) times daily. 120 mL 5  . Calcium Carbonate-Vitamin D (CALCIUM 600+D) 600-400 MG-UNIT per tablet Take 1 tablet by mouth daily.      . formoterol (PERFOROMIST) 20 MCG/2ML nebulizer solution Take 2 mLs (20 mcg total) by nebulization 2 (two) times daily. DX:  J43.9 120 mL 5  . Ipratropium-Albuterol (COMBIVENT RESPIMAT) 20-100 MCG/ACT AERS respimat Inhale 1 puff into the lungs every 6 (six) hours as needed for wheezing or shortness of breath. 3 Inhaler 3  . ipratropium-albuterol (DUONEB) 0.5-2.5 (3) MG/3ML SOLN INHALE 1 VIAL IN NEBULIZER EVERY 4 HOURS IF NEEDED 360 mL 2  . losartan (COZAAR) 25 MG tablet Take 1 tablet by mouth daily.    . metFORMIN (GLUCOPHAGE) 500 MG tablet Take 1 tablet by mouth 2 (two) times daily.    [name] omeprazole (PRILOSEC) 20 MG capsule Take 1 capsule by mouth daily.    . predniSONE (DELTASONE) 2.5 MG tablet Take 2.5 mg by mouth daily.     . roflumilast (DALIRESP) 500 MCG TABS tablet Take 1 tablet (500 mcg total) by mouth daily. 90 tablet 3  . Tiotropium Bromide-Olodaterol (STIOLTO RESPIMAT) 2.5-2.5 MCG/ACT AERS Inhale 2 puffs into the lungs daily. (Patient not taking: Reported on 06/14/2015) 1 Inhaler 5  . traZODone (DESYREL) 100 MG tablet     . Umeclidinium-Vilanterol (ANORO ELLIPTA) 62.5-25 MCG/INH AEPB Inhale 1 Inhaler into the lungs daily. 3 each 3   No current  facility-administered medications on file prior to visit.

## 2015-11-05 NOTE — Telephone Encounter (Signed)
LMTCB w/spouse.

## 2015-11-06 ENCOUNTER — Other Ambulatory Visit: Payer: Self-pay | Admitting: *Deleted

## 2015-11-06 MED ORDER — IPRATROPIUM-ALBUTEROL 0.5-2.5 (3) MG/3ML IN SOLN: RESPIRATORY_TRACT | Status: DC

## 2015-11-06 NOTE — Telephone Encounter (Signed)
Pt aware of rec's per CY. Pt to call if he notices any increased breathing issues while off the Daliresp. Nothing further needed.

## 2015-11-06 NOTE — Telephone Encounter (Signed)
None of those medicines is similar to Daliresp. Suggest he stop Daliresp and see how he does without it.

## 2015-11-06 NOTE — Telephone Encounter (Signed)
LM with wife for patient to return call.

## 2015-11-06 NOTE — Telephone Encounter (Signed)
[phone] pt calling back

## 2015-11-13 ENCOUNTER — Other Ambulatory Visit: Payer: Self-pay | Admitting: Internal Medicine

## 2015-12-12 ENCOUNTER — Encounter: Payer: Self-pay | Admitting: Internal Medicine

## 2015-12-12 ENCOUNTER — Ambulatory Visit (INDEPENDENT_AMBULATORY_CARE_PROVIDER_SITE_OTHER): Payer: Medicare Other | Admitting: Internal Medicine

## 2015-12-12 VITALS — BP 118/62 | HR 94 | Ht 68.0 in | Wt 165.0 lb

## 2015-12-12 DIAGNOSIS — J449 Chronic obstructive pulmonary disease, unspecified: Secondary | ICD-10-CM

## 2015-12-12 NOTE — Progress Notes (Signed)
Patient ID: Gerald [name], male    DOB: 06-03-1946, 70 y.o.   MRN: [id number]  HPI 12/26/10- 79 yoM with wife,. Former smoker followed by Dr Ginette Otto, and coming today on referral from a hospitalist at [facility]. He was dc'd from [place] 2 weeks ago after 3 day stay for acute exacerbation of COPD. Those summaries reviewed.  Current flare began a month ago. He denies routine cough or phlegm at baseline. DOE across parking lot. Still golfs with a cart. Long time smoker. . Sees Pulmonologist Dr Marlowe Sax in [place], but wanted to be seen here. Dx'd w/ COPD in 2010. FEV1 was 0.28%. He now feels he is back to baseline and says it is the best he has been in a year. An ONOX has been scheduled with a local DME company.  CXR 12/13/10- RLL infiltrate suspicious for atelectasis/ pneumonitis with f/u recommended. COPD, probable scarring.  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 Her has been using his inhaler very freuently and we discussed guidelines and alernatives.  02/05/11- COPD, CAD, wife here Since last here has started pulmonary rehab at [facility] and he feels it is helping. Noticing trace ankle edema in recent days.  Feeling a little more bloated and BP goes up with exercise. Continues sleeping with O2 at 2 L/ [facility]. Nebulizer helps clear morning mucus.   06/15/11- 70 year old male former smoker followed for COPD, CAD, .............[name] wife here He completed pulmonary rehabilitation program at [facility] and says it really didn't help him. He is trying to walk more on his own but has not joined any formal program for exercise. He went to the emergency room September 13 with a COPD exacerbation but denies obvious cold or infection at that time. Chest x-ray there was stable with no new process. He was treated with a steroid shot of antibiotic. Comments on some increase in weight and abdominal girth. Denies palpitation tachycardia or ankle  edema. He often uses his Combivent inhaler several times a day. Using his nebulizer machine with Proformist and budesonide twice every day. Sleeps on oxygen at 2 L.  He does gradually got more congested with increasing phlegm. Is now coughing again over the last few days with throat tickle. We discussed and are going to try again with Daliresp as a nonsteroidal anti-inflammatory.   10/21/11- 70 year old male former smoker followed for COPD, CAD, .............[name] wife here C/O: "breathing is fair"; producing more phlegm-no color; wheezing, SOB from time to time; Uses Combivent everyday up to 6 times a day(has used 4 times during the night). He is using up a Combivent units every 2 weeks. He stopped maintenance controller medications, Advair then Symbicort. He feels overall better controlled now with no acute flare. Daliresp seems to help. For the past 2 weeks has been coughing more clear mucus. His wife thinks he is on a downward spiral towards another acute exacerbation. He starts out at night in bed, wearing oxygen. Usually moves to his recliner at sometime during the night and does not take the oxygen with him. Using nebulizer with Perforomist / Budesonide twice daily and rarely uses DuoNeb.  04/20/12-  70 year old male former smoker followed for COPD, CAD...............[name] wife here COPD-Breathing well(has been on Prednisone for 30 days now)before then was using combivent inhaler too much. Only using about 3-4 times if needed only. Has supplemented with duoneb nebulizer once to twice daily-some days not having to use inhaler or neb at all. We started  daily pred 10 mg maintenance July 11. He was more short of breath with a bronchitis in early summer. We called in prednisone and then which was a big help. He still wheezes now morning cough but he rarely hears his own wheeze. Uses neb- either Perforomist/ budesonide or Duoneb, up to 4 times daily. Carries Combivent. We discussed his ipratropium. Has some history  of BPH but tolerating meds without urinary retention. Continues oxygen for sleep at 2 L/[facility] in [place].  COPD assessment test score 15/40.  10/19/12- 70 year old male former smoker followed for COPD, CAD...............[name] wife here FOLLOWS FOR: No increase in SOB or wheezing since last visit. Good winter "fine".  ONOX 06/15/12- still qualifies for O2 sleep- 10 minutes< 89% on room air. Often takes O2 off during night - cannula is "annoying". Discussed. Continues prednisone 5 mg daily, reduced by him from 10/ 5 qod after last ov here. Pred talk.  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  Original Report Authenticated By: Larey Seat, M.D.  04/19/13- 10/19/12- 70 year old male former smoker followed for COPD, CAD...............[name] wife here 6 month followup, has questions regarding medications Using nebulizer 3 or 4 times daily. Continues prednisone 2.5 mg daily. Interested in trying [employer] from [place] [facility]. We discussed his lisinopril again. He feels comfortable staying on it. Continues O2 2L sleep/ [facility] in [place].  10/25/13-   70 year old male former smoker followed for COPD, CAD...............[name] wife here FOLLOWS FOR: Pt reports breathing with some improvement--No concerns today Continues O2 2L sleep/ [place] in [place] Patient has had a good winter with no special problems. Less dyspnea on exertion and walking more. [facility] was too expensive to consider as an option but current meds okay. Wife participated in a discussion of possible sleep apnea symptoms. He agreed to take a booklet. CXR 05/03/13 IMPRESSION:  COPD changes.  No acute abnormalities.  Original Report Authenticated By: Lavonia Dana, MD  04/24/14- 70 year old male former smoker followed for COPD, CAD...............[name] wife here FOLLOWS FOR: Patient denies any flare ups no more than usual.   08/20/2014 Acute OV  Complains of wheezing for last 3 days . Went to urgent care, they  gave him a shot of depomedrol '80mg'$ .   Starting coughing up thick yellow mucus this morning. Hard time breathing with chest congestion. On pulmicort and perforomist neb Twice daily   On ACE for at least 2 years. Has daily cough.  Denies chest pain, orthopnea, fever, edema or n/v/d.   12/12/14- 69 year old male former smoker followed for COPD, CAD...............[name] wife here FOLLOWS FOR: Continues to have wheezing, giving out with walking short distances. Refill sent for Combivent Uses portable nebulizer and/or Combivent up to 6x/ day. Using O2 2L/ sleep/ [facility] in [place] only few hours at night because not sleeping. Increased dyspnea on exertion with ADLs especially over the past 10 days or so. Feels more chest congestion some days. Discussed medications, stimulant impact on sleep quality etc. Usually sleeps with head elevated-pillows or recliner. CXR 04/24/14 IMPRESSION: No active cardiopulmonary disease. Emphysematous disease. Several stable mild compression fractures of the thoracic spine. Electronically Signed  By: Marin Olp M.D.  On: 04/24/2014 11:53   06/14/15- 70 year old male former smoker followed for COPD, CAD.                                         FOLLOWS UP: pt. states breathing is  doing well no SOB occas. Wheezing. Has had flu vaccine Very enthusiastic about his health recently. Says he feels the best in a year. Started to play golf again. Continues maintenance prednisone 2.5 mg, Anoro, using Combivent rescue inhaler only once or twice daily. Needed prednisone taper in mid summer for bronchitis exacerbation.  12/12/2015-70 year old male former smoker followed for COPD, CAD O2 2 L sleep/ [facility]/ [name] Follows for: Emphysema. Pt c/o some SOB/labored breathing with exertion. Pt denies cough/wheeze/CP/tightness. Pt often uses Duoneb and Combivent once daily and seems to be doing well.  Maintenance prednisone 2.5 mg tablet daily, Anoro, nebulizer,  Daliresp He feels he is doing very well and reports plans to resume golfing this spring. Got Depo-Medrol and prednisone taper for an exacerbation during the winter. Daliresp without too expensive and not clearly beneficial. Using his nebulizer about once daily. CXR 06/14/2015 IMPRESSION: 1. Emphysema. 2. No acute cardiopulmonary disease or significant interval change. 3. Remote thoracic compression fractures are stable. 4. Remote left anterior sixth rib fracture. Electronically Signed  By: San Morelle M.D.  On: 06/14/2015 13:43  Review of Systems- See HPI Constitutional:   No weight loss, night sweats,  Fevers, chills, fatigue, lassitude. HEENT:   No headaches,  Difficulty swallowing,  Tooth/dental problems,  Sore throat,                No sneezing, itching, ear ache,                 nasal congestion, post nasal drip,  CV:  No chest pain,  +Orthopnea, PND, , anasarca, dizziness, palpitations GI  No heartburn, indigestion, abdominal pain, nausea, vomiting, diarrhea,  Resp:.   No change in color of mucus.  No wheezing. Skin: no rash or lesions. GU: . MS:  No joint pain or swelling.  No decreased range of motion.  Psych:  No change in mood or affect. No depression or anxiety.  No memory loss.   Objective:   Physical Exam General- Alert, Oriented, Affect-appropriate, Distress- none acute,  overweight, Resting room air saturation 95% Skin- rash-none, lesions- none, excoriation- none Lymphadenopathy- none Head- atraumatic            Eyes- Gross vision intact, PERRLA, conjunctivae clear secretions            Ears- Hearing aids            Nose- Clear, no-Septal dev, mucus, polyps, erosion, perforation             Throat- Mallampati II , mucosa clear , drainage- none, tonsils- atrophic,  Neck- flexible , trachea midline, no stridor , thyroid nl, carotid no bruit Chest - symmetrical excursion , unlabored           Heart/CV- RRR , no murmur , no gallop  , no rub, nl s1 s2                            - JVD- none , edema- none, stasis changes- none, varices- none           Lung- +distant/Clear , unlabored, wheeze-none, cough- none , dullness-none, rub- none           Chest wall-  Abd-  Br/ Gen/ Rectal- Not done, not indicated Extrem- cyanosis- none, clubbing, none, atrophy- none, strength- nl Neuro- grossly intact to observation

## 2015-12-12 NOTE — Patient Instructions (Signed)
We can continue routine meds but stop Daliresp when it runs out  Please call if we can help

## 2015-12-13 ENCOUNTER — Encounter: Payer: Self-pay | Admitting: Internal Medicine

## 2015-12-13 NOTE — Assessment & Plan Note (Signed)
Severe COPD with exacerbation consistent with a viral bronchitis back in the winter but improved to baseline now. Plan-DC'd Daliresp

## 2016-02-01 ENCOUNTER — Other Ambulatory Visit: Payer: Self-pay | Admitting: Internal Medicine

## 2016-06-15 ENCOUNTER — Encounter: Payer: Self-pay | Admitting: Internal Medicine

## 2016-06-15 ENCOUNTER — Ambulatory Visit (INDEPENDENT_AMBULATORY_CARE_PROVIDER_SITE_OTHER)
Admission: RE | Admit: 2016-06-15 | Discharge: 2016-06-15 | Disposition: A | Discharge status: Home / Self Care | Payer: Medicare Other | Source: Ambulatory Visit | Attending: Internal Medicine | Admitting: Internal Medicine

## 2016-06-15 ENCOUNTER — Ambulatory Visit (INDEPENDENT_AMBULATORY_CARE_PROVIDER_SITE_OTHER): Payer: Medicare Other | Admitting: Internal Medicine

## 2016-06-15 VITALS — BP 138/62 | HR 73 | Ht 68.0 in | Wt 181.0 lb

## 2016-06-15 DIAGNOSIS — J9611 Chronic respiratory failure with hypoxia: Secondary | ICD-10-CM

## 2016-06-15 DIAGNOSIS — Z23 Encounter for immunization: Secondary | ICD-10-CM | POA: Diagnosis not present

## 2016-06-15 DIAGNOSIS — J449 Chronic obstructive pulmonary disease, unspecified: Secondary | ICD-10-CM

## 2016-06-15 NOTE — Assessment & Plan Note (Signed)
He seems back to baseline now after what ever was bothering him on his cruise. We want to take another look at his oxygen requirement at night. Plan-chest x-ray, overnight oximetry, flu vaccine.

## 2016-06-15 NOTE — Progress Notes (Signed)
Patient ID: Gerald [name], male    DOB: 07-Nov-1945, 70 y.o.   MRN: [id number]  HPI Male former heavy smoker followed for COPD complicated by CAD    57/32/2025-70 year old male former smoker followed for COPD, CAD O2 2 L sleep/ [facility]/ [name] Follows for: Emphysema. Pt c/o some SOB/labored breathing with exertion. Pt denies cough/wheeze/CP/tightness. Pt often uses Duoneb and Combivent once daily and seems to be doing well.  Maintenance prednisone 2.5 mg tablet daily, Anoro, nebulizer, Daliresp He feels he is doing very well and reports plans to resume golfing this spring. Got Depo-Medrol and prednisone taper for an exacerbation during the winter. Daliresp without too expensive and not clearly beneficial. Using his nebulizer about once daily. CXR 06/14/2015 IMPRESSION: 1. Emphysema. 2. No acute cardiopulmonary disease or significant interval change. 3. Remote thoracic compression fractures are stable. 4. Remote left anterior sixth rib fracture. Electronically Signed  By: San Morelle M.D.  On: 06/14/2015 13:43  06/15/2016-70 year old male former smoker followed for COPD, hypoxic respiratory failure, complicated by CAD FOLLOWS FOR: Pt states he is having SOB-just got back from cruise to [place] hard time with SOB on cruise as well. Noted easy dyspnea on exertion while on a coastal cruise in [place] and [place]. He didn't think he had an infection or was doing unusual exertion. Better since he got back home. He stopped using his oxygen-didn't used on the trip and is not using it at home because he is sleeping propped up in a recliner in another room. Has been off prednisone since early June and trying to minimize steroids because of easy bruising and abdominal weight gain which ccrowds his breathing. Does continue budesonide and Perforomist nebulizer treatment twice daily. Denies chest pain, palpitation, blood or purulent sputum.   Review of Systems- See  HPI Constitutional:   No weight loss, night sweats,  Fevers, chills, fatigue, lassitude. HEENT:   No headaches,  Difficulty swallowing,  Tooth/dental problems,  Sore throat,                No sneezing, itching, ear ache,                 nasal congestion, post nasal drip,  CV:  No chest pain,  +Orthopnea, PND, , anasarca, dizziness, palpitations GI  No heartburn, indigestion, abdominal pain, nausea, vomiting, diarrhea,  Resp:.   No change in color of mucus.  No wheezing. Skin: no rash or lesions. GU: . MS:  No joint pain or swelling.  No decreased range of motion.  Psych:  No change in mood or affect. No depression or anxiety.  No memory loss.   Objective:   Physical Exam General- Alert, Oriented, Affect-appropriate, Distress- none acute,  overweight,  Skin- rash-none, lesions- none, excoriation- none Lymphadenopathy- none Head- atraumatic            Eyes- Gross vision intact, PERRLA, conjunctivae clear secretions            Ears- Hearing aids            Nose- Clear, no-Septal dev, mucus, polyps, erosion, perforation             Throat- Mallampati II , mucosa clear , drainage- none, tonsils- atrophic,  Neck- flexible , trachea midline, no stridor , thyroid nl, carotid no bruit Chest - symmetrical excursion , unlabored           Heart/CV- RRR , no murmur , no gallop  , no rub, nl s1 s2                           -  JVD- none , edema- none, stasis changes- none, varices- none           Lung- +distant/Clear , unlabored, wheeze-none, cough- none , dullness-none, rub- none           Chest wall-  Abd-  Br/ Gen/ Rectal- Not done, not indicated Extrem- cyanosis- none, clubbing, none, atrophy- none, strength- nl Neuro- grossly intact to observation

## 2016-06-15 NOTE — Patient Instructions (Signed)
Flu vax  Order CXR    Dx COPD mixed type  Order- schedule ONOX room air      Dx chronic respiratory failure with hypoxia  Please call as needed

## 2016-07-03 ENCOUNTER — Encounter: Payer: Self-pay | Admitting: Internal Medicine

## 2016-07-08 ENCOUNTER — Telehealth: Payer: Self-pay | Admitting: Internal Medicine

## 2016-07-08 NOTE — Telephone Encounter (Signed)
Pt is aware of results and understands to continue wearing his O2 QHS. Nothing more needed at this time.

## 2016-10-11 ENCOUNTER — Other Ambulatory Visit: Payer: Self-pay | Admitting: Internal Medicine

## 2016-11-16 ENCOUNTER — Other Ambulatory Visit: Payer: Self-pay | Admitting: Internal Medicine

## 2016-12-18 ENCOUNTER — Ambulatory Visit (INDEPENDENT_AMBULATORY_CARE_PROVIDER_SITE_OTHER): Payer: Medicare Other | Admitting: Internal Medicine

## 2016-12-18 ENCOUNTER — Encounter: Payer: Self-pay | Admitting: Internal Medicine

## 2016-12-18 VITALS — BP 114/68 | HR 66 | Ht 68.0 in | Wt 165.0 lb

## 2016-12-18 DIAGNOSIS — G4733 Obstructive sleep apnea (adult) (pediatric): Secondary | ICD-10-CM | POA: Insufficient documentation

## 2016-12-18 DIAGNOSIS — J449 Chronic obstructive pulmonary disease, unspecified: Secondary | ICD-10-CM | POA: Diagnosis not present

## 2016-12-18 MED ORDER — FLUTICASONE-UMECLIDIN-VILANT 100-62.5-25 MCG/INH IN AEPB: 1.0000 | INHALATION_SPRAY | Freq: Every day | RESPIRATORY_TRACT | 12 refills | Status: DC

## 2016-12-18 MED ORDER — FLUTICASONE-UMECLIDIN-VILANT 100-62.5-25 MCG/INH IN AEPB: 1.0000 | INHALATION_SPRAY | Freq: Every day | RESPIRATORY_TRACT | 0 refills | Status: DC

## 2016-12-18 NOTE — Assessment & Plan Note (Signed)
He did not desaturate to qualify for portable oxygen and was able to perform walk test indoors on level ground on room air with sustained brisk pace without desaturating.. I suggested he try to do more walking for stamina. He had previously done a pulmonary rehabilitation program. Plan-at his request we will let him try Trelegy and discussed comparison with Anoro and his neb treatments.

## 2016-12-18 NOTE — Progress Notes (Signed)
Patient ID: Gerald [name], male    DOB: Aug 17, 1946, 71 y.o.   MRN: [id number]  HPI Male former heavy smoker followed for COPD complicated by CAD PFT [name]- very severe obstructive airways disease with insignificant response to bronchodilator. FVC 2.97/76%, FEV1 0.83/77%, ratio 0.28, TLC 115%, DLCO 52% Walk Test on room air 12/18/2016-92% at rest, 93%, 94%, 93% walking 3 laps at brisk pace  ------------------------------------------------------------------------------------------------------  06/15/2016-71 year old male former smoker followed for COPD, hypoxic respiratory failure, complicated by CAD FOLLOWS FOR: Pt states he is having SOB-just got back from cruise to [place] hard time with SOB on cruise as well. Noted easy dyspnea on exertion while on a coastal cruise in [place] and [place]. He didn't think he had an infection or was doing unusual exertion. Better since he got back home. He stopped using his oxygen-didn't used on the trip and is not using it at home because he is sleeping propped up in a recliner in another room. Has been off prednisone since early June and trying to minimize steroids because of easy bruising and abdominal weight gain which ccrowds his breathing. Does continue budesonide and Perforomist nebulizer treatment twice daily. Denies chest pain, palpitation, blood or purulent sputum.  12/18/2016- 45 year-old male former smoker followed for COPD, hypoxic respiratory failure, complicated by CAD, OSA( [facility]) 68yrf/u for COPD.   Wife here [facility] oxygen 2 L [facility] for sustained exacerbation treated without antibiotics but given steroid injection. Now back to baseline. Little routine cough or phlegm. Dyspnea on exertion walking one block or from cart to golf hole and back to cart. Reports CXR with November urology evaluation was stable. Likes Anoro, but had heard about Trelegy and wants to know if  a trial of that could be appropriate-discussed.. Combivent rescue inhaler about once daily. Nebulizer Perforomist/ budesonide twice daily Sleep study ordered by his primary physician reportedly showed AHI 19/hour. We discussed CPAP with O2. [place] Test on room air 12/18/2016-92% at rest, 93%, 94%, 93% walking 3 laps at brisk pace CXR 06/15/2016-IMPRESSION: Stable chronic lung disease.  Review of Systems- See HPI    + = pos Constitutional:   No weight loss, night sweats,  Fevers, chills, fatigue, lassitude. HEENT:   No headaches,  Difficulty swallowing,  Tooth/dental problems,  Sore throat,                No sneezing, itching, ear ache,                 nasal congestion, post nasal drip,  CV:  No chest pain,  +Orthopnea, PND, , anasarca, dizziness, palpitations GI  No heartburn, indigestion, abdominal pain, nausea, vomiting, diarrhea,  Resp:.   No change in color of mucus.  No wheezing. Skin: no rash or lesions. GU: . MS:  No joint pain or swelling.  No decreased range of motion.  Psych:  No change in mood or affect. No depression or anxiety.  No memory loss.   Objective:   Physical Exam General- Alert, Oriented, Affect-appropriate, Distress- none acute,  Skin- rash-none, lesions- none, excoriation- none Lymphadenopathy- none Head- atraumatic            Eyes- Gross vision intact, PERRLA, conjunctivae clear secretions            Ears- Hearing aids            Nose- Clear, no-Septal dev, mucus, polyps, erosion, perforation  Throat- Mallampati II , mucosa clear , drainage- none, tonsils- atrophic,  Neck- flexible , trachea midline, no stridor , thyroid nl, carotid no bruit Chest - symmetrical excursion , unlabored           Heart/CV- RRR , no murmur , no gallop  , no rub, nl s1 s2                           - JVD- none , edema- none, stasis changes- none, varices- none           Lung- +distant/Clear , unlabored, wheeze-none, cough-  none , dullness-none, rub- none. Able to walk briskly.           Chest wall-  Abd-  Br/ Gen/ Rectal- Not done, not indicated Extrem- cyanosis- none, clubbing, none, atrophy- none, strength- nl Neuro- grossly intact to observation

## 2016-12-18 NOTE — Assessment & Plan Note (Signed)
I discussed different roles of CPAP and oxygen therapy and how DME company with be able to treat oxygen through his CPAP at night. We won't get involved in his CPAP management here unless requested.

## 2016-12-18 NOTE — Patient Instructions (Addendum)
Sample and script printed to try Trelegy Ellipta inhale   1 puff then rinse mouth,  Once daily                      Try this instead of Anoro for comparison  Ok to continue the nebulizer and Combivent rescue inhaler as before  Order- Walk test on room air    Dx COPD mixed type

## 2017-02-16 ENCOUNTER — Other Ambulatory Visit: Payer: Self-pay | Admitting: Internal Medicine

## 2017-02-16 MED ORDER — FLUTICASONE-UMECLIDIN-VILANT 100-62.5-25 MCG/INH IN AEPB: 1.0000 | INHALATION_SPRAY | Freq: Every day | RESPIRATORY_TRACT | 4 refills | Status: DC

## 2017-03-10 ENCOUNTER — Telehealth: Payer: Self-pay | Admitting: Internal Medicine

## 2017-03-10 NOTE — Telephone Encounter (Signed)
Yes it is ok to switch back to Anoro if he is having problems with Trelegy. I will also route this to Dr. Annamaria Boots to make him aware. Thank you.

## 2017-03-10 NOTE — Telephone Encounter (Signed)
Patient returned call, CB is [phone], states will keep phone with him.

## 2017-03-10 NOTE — Telephone Encounter (Signed)
ATC pt x1 mailbox was full

## 2017-03-10 NOTE — Telephone Encounter (Signed)
Spoke with pt and he is aware of JN's message. Pt is going to switch back to Anoro and states he will await to see if CY has any further recommendations when he comes back. He had no further questions at this time.

## 2017-03-10 NOTE — Telephone Encounter (Signed)
Spoke with pt, states that he is doing well on the Trelegy but the steroid component in the inhaler is making patient bruise very easily.  Because of this, pt wishes to go back to [facility], which still helped with his breathing symptoms but did not bruise him.  Pt wants to ensure this is ok, and make sure he doesn't need to taker off the trelegy d/t the steroid component.   Pt does not wish to wait until next week for a response- will send to DOD for recs.    JN please advise if ok for pt to switch back to Trelegy.  Thanks

## 2017-03-15 NOTE — Telephone Encounter (Signed)
CY please advise on any further recommendations if possible.  Thanks!

## 2017-03-15 NOTE — Telephone Encounter (Signed)
Noted  

## 2017-03-16 NOTE — Telephone Encounter (Signed)
Any further recommendations Dr Annamaria Boots or can we close this message? Thanks.

## 2017-03-16 NOTE — Telephone Encounter (Signed)
Spoke with pt,aware of recs.  Nothing further needed.  

## 2017-03-16 NOTE — Telephone Encounter (Signed)
Ok to stay on Anoro until next ov. We will discuss then, unless he has problems sooner.

## 2017-06-17 ENCOUNTER — Telehealth: Payer: Self-pay | Admitting: Internal Medicine

## 2017-06-17 NOTE — Telephone Encounter (Signed)
Yes- should be no problem

## 2017-06-17 NOTE — Telephone Encounter (Signed)
Called and spoke with pt and he wanted to call and ask CY if it is ok for him to take the sildenafil with his COPD dx.  He stated that he did ask the urologist this morning at his appt and he said that it would be fine, but he wanted to check with CY.  CY please advise. Thanks  Next ov---12/22/17  No Known Allergies

## 2017-06-17 NOTE — Telephone Encounter (Signed)
Spoke with pt, advised CY stated this was ok. Nothing further is needed.

## 2017-09-12 ENCOUNTER — Other Ambulatory Visit: Payer: Self-pay | Admitting: Internal Medicine

## 2017-11-01 ENCOUNTER — Other Ambulatory Visit: Payer: Self-pay | Admitting: Internal Medicine

## 2017-12-22 ENCOUNTER — Encounter: Payer: Self-pay | Admitting: Internal Medicine

## 2017-12-22 ENCOUNTER — Ambulatory Visit (INDEPENDENT_AMBULATORY_CARE_PROVIDER_SITE_OTHER)
Admission: RE | Admit: 2017-12-22 | Discharge: 2017-12-22 | Disposition: A | Discharge status: Home / Self Care | Payer: Medicare Other | Source: Ambulatory Visit | Attending: Internal Medicine | Admitting: Internal Medicine

## 2017-12-22 ENCOUNTER — Ambulatory Visit (INDEPENDENT_AMBULATORY_CARE_PROVIDER_SITE_OTHER): Payer: Medicare Other | Admitting: Internal Medicine

## 2017-12-22 VITALS — BP 112/64 | HR 77 | Ht 68.0 in | Wt 169.8 lb

## 2017-12-22 DIAGNOSIS — G4733 Obstructive sleep apnea (adult) (pediatric): Secondary | ICD-10-CM | POA: Diagnosis not present

## 2017-12-22 DIAGNOSIS — J449 Chronic obstructive pulmonary disease, unspecified: Secondary | ICD-10-CM

## 2017-12-22 MED ORDER — FLUTICASONE-UMECLIDIN-VILANT 100-62.5-25 MCG/INH IN AEPB: 1.0000 | INHALATION_SPRAY | Freq: Every day | RESPIRATORY_TRACT | 12 refills | Status: DC

## 2017-12-22 MED ORDER — FLUTTER DEVI: 0 refills | Status: AC

## 2017-12-22 NOTE — Assessment & Plan Note (Signed)
He indicated CPAP set at 8 and managed through his PCP and [facility].

## 2017-12-22 NOTE — Patient Instructions (Addendum)
Order CXR   Dx COPD mixed type  Script printed Trelegy with coupon     Inhale 1 puff, then rinse mouth, once daily  As discussed- You can try just staying on Trelegy, using nebulizer when needed, or once daily, or twice daily when needed.  Script printed- provide Flutter device   Blow through 4 times per set, 3 sets per day, when needed to loosen mucus in your airways.

## 2017-12-22 NOTE — Progress Notes (Signed)
Patient ID: Gerald [name], male    DOB: 10-Jun-1946, 72 y.o.   MRN: [id number]  HPI Male former heavy smoker followed for COPD complicated by CAD PFT 66/44/0347[name] very severe obstructive airways disease with insignificant response to bronchodilator. FVC 2.97/76%, FEV1 0.83/77%, ratio 0.28, TLC 115%, DLCO 52% Walk Test on room air 12/18/2016-92% at rest, 93%, 94%, 93% walking 3 laps at brisk pace  ------------------------------------------------------------------------------------------------------ 12/18/2016- 36 year-old male former smoker followed for COPD, hypoxic respiratory failure, complicated by CAD, OSA( [place]) 47yr f/u for COPD.   Wife here Using oxygen 2 L for sleep/ [facility] in [facility] in February for sustained exacerbation treated without antibiotics but given steroid injection. Now back to baseline. Little routine cough or phlegm. Dyspnea on exertion walking one block or from cart to golf hole and back to cart. Reports CXR with November urology evaluation was stable. Likes Anoro, but had heard about Trelegy and wants to know if a trial of that could be appropriate-discussed.. Combivent rescue inhaler about once daily. Nebulizer Perforomist/ budesonide twice daily Sleep study ordered by his primary physician reportedly showed AHI 19/hour. We discussed CPAP with O2. His sleep apnea will be managed in [place]. Walk Test on room air 12/18/2016-92% at rest, 93%, 94%, 93% walking 3 laps at brisk pace CXR 06/15/2016-IMPRESSION: Stable chronic lung disease.  12/22/17- 35 year-old male former smoker followed for COPD, hypoxic respiratory failure, complicated by CAD, OSA( [place]) CPAP 8/ [facility]    Also O2 2L used prn- home concentrator ----COPD mixed type: Pt states he has the usual breathing issues-cough and SOB at times. Pt would like to discuss Trelegy vs nebulizer medications and if able to use one over the other.  Neb Perforomist/budesonide  0.5, Combivent Respimat,, Anoro,  Several weeks ago got steroid inj and taper for bronchitic flare, no abx- worked well. Today feeling well. Using both Trelegy and neb budesonide he had easy bruising which resolved when he changed to neb once daily and quit Trelegy. Continues CPAP managed by his PCP.   Review of Systems- See HPI    + = pos Constitutional:   No weight loss, night sweats,  Fevers, chills, fatigue, lassitude. HEENT:   No headaches,  Difficulty swallowing,  Tooth/dental problems,  Sore throat,                No sneezing, itching, ear ache,                 nasal congestion, post nasal drip,  CV:  No chest pain,  +Orthopnea, PND, , anasarca, dizziness, palpitations GI  No heartburn, indigestion, abdominal pain, nausea, vomiting, diarrhea,  Resp:.   No change in color of mucus.  No wheezing. Skin: no rash or lesions. GU: . MS:  No joint pain or swelling.  No decreased range of motion.  Psych:  No change in mood or affect. No depression or anxiety.  No memory loss.   Objective:   Physical Exam General- Alert, Oriented, Affect-appropriate, Distress- none acute,  Skin- rash-none, lesions- none, excoriation- none Lymphadenopathy- none Head- atraumatic            Eyes- Gross vision intact, PERRLA, conjunctivae clear secretions            Ears- Hearing aids            Nose- Clear, no-Septal dev, mucus, polyps, erosion, perforation             Throat- Mallampati II-III , mucosa clear , drainage-  none, tonsils- atrophic,  Neck- flexible , trachea midline, no stridor , thyroid nl, carotid no bruit Chest - symmetrical excursion , unlabored           Heart/CV- RRR , no murmur , no gallop  , no rub, nl s1 s2                           - JVD- none , edema- none, stasis changes- none, varices- none           Lung- +distant/Clear , unlabored, wheeze-none, cough- none , dullness-none, rub-                                 none.            Chest wall-  Abd-  Br/ Gen/ Rectal- Not done, not  indicated Extrem- cyanosis- none, clubbing, none, atrophy- none, strength- nl Neuro- grossly intact to observation

## 2018-01-28 ENCOUNTER — Telehealth: Payer: Self-pay | Admitting: Internal Medicine

## 2018-02-05 ENCOUNTER — Other Ambulatory Visit: Payer: Self-pay | Admitting: Internal Medicine

## 2018-02-10 NOTE — Telephone Encounter (Signed)
Dr. Hilarie Fredrickson reviewed records and indicated that pt is due for repeat colon in Oct 2019. A mgs was left in pt's phone with this information. Recall date entered. Records in referral folder.

## 2018-03-02 ENCOUNTER — Other Ambulatory Visit: Payer: Self-pay | Admitting: Internal Medicine

## 2018-03-02 MED ORDER — IPRATROPIUM-ALBUTEROL 20-100 MCG/ACT IN AERS: INHALATION_SPRAY | RESPIRATORY_TRACT | 1 refills | Status: DC

## 2018-03-02 NOTE — Telephone Encounter (Signed)
Okay per CY to refill 90 day supply with 3 additional refills.

## 2018-03-29 NOTE — Telephone Encounter (Signed)
Patient has not returned phone call to schedule. Records will be in "records reviewed" folder. °

## 2018-04-25 ENCOUNTER — Telehealth: Payer: Self-pay | Admitting: Internal Medicine

## 2018-04-25 ENCOUNTER — Encounter: Payer: Self-pay | Admitting: Internal Medicine

## 2018-04-25 ENCOUNTER — Telehealth: Payer: Self-pay | Admitting: *Deleted

## 2018-04-25 ENCOUNTER — Encounter: Payer: Self-pay | Admitting: *Deleted

## 2018-04-25 MED ORDER — PREDNISONE 10 MG PO TABS: ORAL_TABLET | ORAL | 0 refills | Status: DC

## 2018-04-25 NOTE — Telephone Encounter (Signed)
Pt aware of recs.  rx sent to preferred pharmacy.  Nothing further needed.  

## 2018-04-25 NOTE — Telephone Encounter (Signed)
Spoke with pt, c/o increased SOB X1.5 weeks with any exertion, wheezing, prod cough with clear mucus.   Denies chest tightness/pain, fever, sinus congestion. Pt has been taking inhalers as prescribed, using rescue inhaler 5-6X daily.  Requesting additional recs.   [facility] [address] in [place].   CY please advise on recs.  Thanks!

## 2018-04-25 NOTE — Telephone Encounter (Signed)
This encounter was created in error - please disregard.

## 2018-04-25 NOTE — Telephone Encounter (Signed)
Pt reports increased SOB x 1- 1/2 weeks; with exertion only. Pt states he thought he was calling Dr. Janee Morn office. Dr. Janee Morn number given.

## 2018-04-25 NOTE — Telephone Encounter (Signed)
Offer prednisone taper 10 mg, # 20, 4 X 2 DAYS, 3 X 2 DAYS, 2 X 2 DAYS, 1 X 2 DAYS  

## 2018-04-30 ENCOUNTER — Other Ambulatory Visit: Payer: Self-pay | Admitting: Internal Medicine

## 2018-05-05 ENCOUNTER — Encounter: Payer: Self-pay | Admitting: Internal Medicine

## 2018-05-31 ENCOUNTER — Ambulatory Visit (INDEPENDENT_AMBULATORY_CARE_PROVIDER_SITE_OTHER)
Admission: RE | Admit: 2018-05-31 | Discharge: 2018-05-31 | Disposition: A | Discharge status: Home / Self Care | Payer: Medicare Other | Source: Ambulatory Visit | Attending: Nurse Practitioner | Admitting: Nurse Practitioner

## 2018-05-31 ENCOUNTER — Ambulatory Visit (INDEPENDENT_AMBULATORY_CARE_PROVIDER_SITE_OTHER): Payer: Medicare Other | Admitting: Nurse Practitioner

## 2018-05-31 ENCOUNTER — Encounter: Payer: Self-pay | Admitting: Nurse Practitioner

## 2018-05-31 VITALS — BP 128/64 | HR 67 | Ht 68.0 in | Wt 175.4 lb

## 2018-05-31 DIAGNOSIS — J441 Chronic obstructive pulmonary disease with (acute) exacerbation: Secondary | ICD-10-CM

## 2018-05-31 DIAGNOSIS — J449 Chronic obstructive pulmonary disease, unspecified: Secondary | ICD-10-CM

## 2018-05-31 DIAGNOSIS — R0602 Shortness of breath: Secondary | ICD-10-CM

## 2018-05-31 DIAGNOSIS — G4733 Obstructive sleep apnea (adult) (pediatric): Secondary | ICD-10-CM | POA: Diagnosis not present

## 2018-05-31 MED ORDER — PREDNISONE 10 MG PO TABS: ORAL_TABLET | ORAL | 0 refills | Status: DC

## 2018-05-31 MED ORDER — AZITHROMYCIN 250 MG PO TABS: ORAL_TABLET | ORAL | 0 refills | Status: DC

## 2018-05-31 NOTE — Progress Notes (Signed)
@Patient  ID: Gerald [name], male    DOB: October 01, 1945, 72 y.o.   MRN: [id number]  Chief Complaint  Patient presents with  . Shortness of Breath    Worse in recent months    Referring provider: Olena Mater, MD  HPI  72 year old male former heavy smoker with COPD complicated by CAD followed by Dr. Annamaria Boots.   Tests: PFT 08/02/2009- [place]- very severe obstructive airways disease with insignificant response to bronchodilator. FVC 2.97/76%, FEV1 0.83/77%, ratio 0.28, TLC 115%, DLCO 52% Walk Test on room air 12/18/2016-92% at rest, 93%, 94%, 93% walking 3 laps at brisk pace    Tests: Spirometry - FVC 1.8/44%, FEV/FVC 38%, FEV1 0.7/23% PFT 08/02/2009- [place]- very severe obstructive airways disease with insignificant response to bronchodilator. FVC 2.97/76%, FEV1 0.83/77%, ratio 0.28, TLC 115%, DLCO 52% Walk Test on room air 12/18/2016-92% at rest, 93%, 94%, 93% walking 3 laps at brisk pace Walk test 05/31/18 on room air - 96% at rest, 95%, 91%, 86% walking 3 laps at brisk pace  OV 05/31/18 - acute/shortness of breath  Patient presents for increased shortness of breath states that over the past month he has been having increased shortness of breath and wheezing at times. He finds it difficult to do ADL's without getting short of breath and needing to stop to rest. He has noticed some slight peripheral edema, but states that he had a cardiology appointment last week and the cardiologist was not concerned with the edema. He denies any chest pain, fever, or nausea. He denies any sinus congestion or pressure. He has been compliant with Pulmicort, combivent, and anoro. He was prescribed trelegy at last visit with Dr. Annamaria Boots, but states that he could not afford it. He has also been using performist. He states that he has not been compliant with lasix. He only takes lasix about every other day. He does have PRN O2 at home, but has not been using it. He also has CPAP and is compliant with the  CPAP.    No Known Allergies  Immunization History  Administered Date(s) Administered  . Influenza Split 06/15/2011, 06/15/2012, 05/30/2013, 06/05/2015  . Influenza Whole 07/15/2010  . Influenza, High Dose Seasonal PF 07/04/2014, 06/06/2015  . Influenza,inj,Quad PF,6+ Mos 06/15/2016  . Influenza-Unspecified 07/13/2002, 07/03/2009, 05/30/2013, 07/17/2014, 06/22/2017  . Pneumococcal Conjugate-13 10/25/2013, 05/12/2017  . Pneumococcal Polysaccharide-23 04/14/2010  . Pneumococcal-Unspecified 09/28/2003, 02/03/2010, 10/25/2013  . Tdap 06/05/2013  . Zoster 09/05/2007, 09/03/2010    Past Medical History:  Diagnosis Date  . Bladder cancer (HCC)    BCG treatments  . CAD (coronary artery disease)   . COPD (chronic obstructive pulmonary disease) ([place])   . Heart attack ([place])   . Kidney disease     Tobacco History: Social History   Tobacco Use  Smoking Status Former Smoker  . Packs/day: 1.00  . Years: 50.00  . Pack years: 50.00  . Types: Cigarettes  . Last attempt to quit: 09/14/2008  . Years since quitting: 9.7  Smokeless Tobacco Never Used   Counseling given: Yes   Outpatient Encounter Medications as of 05/31/2018  Medication Sig  . alendronate (FOSAMAX) 70 MG tablet Take 70 mg by mouth every 7 (seven) days.   Jearl Klinefelter ELLIPTA 62.5-25 MCG/INH AEPB USE 1 INHALATION DAILY  . aspirin 81 MG tablet Take 81 mg by mouth as needed.   [name] atorvastatin (LIPITOR) 80 MG tablet Take 80 mg by mouth daily.    . Calcium Carbonate-Vitamin D (CALCIUM 600+D) 600-400 MG-UNIT per  tablet Take 1 tablet by mouth daily.    . Ipratropium-Albuterol (COMBIVENT RESPIMAT) 20-100 MCG/ACT AERS respimat USE 1 INHALATION EVERY 6 HOURS AS NEEDED FOR WHEEZING OR SHORTNESS OF BREATH  . losartan (COZAAR) 25 MG tablet Take 1 tablet by mouth daily.  . metFORMIN (GLUCOPHAGE) 500 MG tablet Take 1,000 mg by mouth 2 (two) times daily.   [name] omeprazole (PRILOSEC) 20 MG capsule Take 1 capsule by mouth daily.  . predniSONE  (DELTASONE) 10 MG tablet 40mg X2 days, 30mg  X2 days, 20mg  X2 days, 10mg X2 days, then stop.  [name] Respiratory Therapy Supplies (FLUTTER) DEVI Blow through 4 times per set, 3 sets per day  . traZODone (DESYREL) 100 MG tablet   . azithromycin (ZITHROMAX) 250 MG tablet Take 2 tablets (500 mg) PO on day 1, then take 1 tablet (250 mg) on days 2-5.  . budesonide (PULMICORT) 0.5 MG/2ML nebulizer solution Take 2 mLs (0.5 mg total) by nebulization 2 (two) times daily.  . predniSONE (DELTASONE) 10 MG tablet Take 4 tabs for 2 days, then 3 tabs for 2 days, then 2 tabs for 2 days, then 1 tab for 2 days, then stop  . [DISCONTINUED] Fluticasone-Umeclidin-Vilant (TRELEGY ELLIPTA) 100-62.5-25 MCG/INH AEPB Inhale 1 puff into the lungs daily. Rinse mouth (Patient not taking: Reported on 05/31/2018)  . [DISCONTINUED] formoterol (PERFOROMIST) 20 MCG/2ML nebulizer solution Take 2 mLs (20 mcg total) by nebulization 2 (two) times daily. DX:  J43.9   No facility-administered encounter medications on file as of 05/31/2018.      Review of Systems  Review of Systems  Constitutional: Negative.  Negative for chills and fever.  HENT: Negative.  Negative for congestion and sinus pain.   Respiratory: Positive for shortness of breath and wheezing. Negative for cough.   Cardiovascular: Positive for leg swelling. Negative for chest pain and palpitations.  Gastrointestinal: Negative.   Allergic/Immunologic: Negative.   Neurological: Negative.   Psychiatric/Behavioral: Negative.        Physical Exam  BP 128/64 (BP Location: Left Arm, Patient Position: Sitting, Cuff Size: Normal)   Pulse 67   Ht 5\' 8"  (1.727 m)   Wt 175 lb 6.4 oz (79.6 kg)   SpO2 96%   BMI 26.67 kg/m   Wt Readings from Last 5 Encounters:  05/31/18 175 lb 6.4 oz (79.6 kg)  12/22/17 169 lb 12.8 oz (77 kg)  12/18/16 165 lb (74.8 kg)  06/15/16 181 lb (82.1 kg)  12/12/15 165 lb (74.8 kg)     Physical Exam  Constitutional: He is oriented to person,  place, and time. He appears well-developed and well-nourished. No distress.  Cardiovascular: Normal rate and regular rhythm.  Pulmonary/Chest: Effort normal. He has wheezes.  Musculoskeletal:       Right lower leg: He exhibits edema (1+ bilateral).  Neurological: He is alert and oriented to person, place, and time.  Skin: Skin is warm and dry.  Psychiatric: He has a normal mood and affect.  Nursing note and vitals reviewed.   Imaging: Dg Chest 2 View  Result Date: 05/31/2018 CLINICAL DATA:  Prior myocardial infarct. Chronic shortness of breath EXAM: CHEST - 2 VIEW COMPARISON:  December 22, 2017 FINDINGS: There is mild hyperexpansion with areas of scattered scarring bilaterally. There is no frank edema or consolidation. Heart size and pulmonary vascularity are normal. There is aortic atherosclerosis. No adenopathy. There is evidence of an old healed fracture of the posterior left ninth rib. There is stable anterior wedging of several mid and lower thoracic vertebral bodies. There  is left carotid artery calcification. IMPRESSION: No edema or consolidation. Areas of scattered scarring, stable. Lungs hyperexpanded. Stable cardiac silhouette. There is aortic atherosclerosis. Aortic Atherosclerosis (ICD10-I70.0). Electronically Signed   By: Lowella Grip III M.D.   On: 05/31/2018 11:01     Assessment & Plan:   COPD with acute exacerbation [facility]) Patient Instructions  Spirometry checked in office today Will check chest x ray and labs and will call with results Will start prednisone taper Will start azithromycin Walked in office today - sats dropped to 86% Wear O2 at 2.5L with activity Continue anoro Continue pulmicort Continue combivent May try mucinex Take lasix daily until follow up Follow up in 1 week with Dr. Annamaria Boots Please call if symptoms worsen     Obstructive sleep apnea Continue CPAP at current settings  Shortness of breath Patient Instructions  Spirometry checked in  office today Will check chest x ray and labs and will call with results Will start prednisone taper Will start azithromycin Walked in office today - sats dropped to 86% Wear O2 at 2.5L with activity Continue anoro Continue pulmicort Continue combivent May try mucinex Take lasix daily until follow up Follow up in 1 week with Dr. Annamaria Boots Please call if symptoms worsen        Fenton Foy, NP 06/01/2018

## 2018-05-31 NOTE — Patient Instructions (Addendum)
Spirometry checked in office today Will check chest x ray and labs and will call with results Will start prednisone taper Will start azithromycin Walked in office today - sats dropped to 86% Wear O2 at 2.5L with activity Continue anoro Continue pulmicort Continue combivent May try mucinex Take lasix daily until follow up Follow up in 1 week with Dr. Annamaria Boots Please call if symptoms worsen

## 2018-06-01 ENCOUNTER — Telehealth: Payer: Self-pay | Admitting: General Surgery

## 2018-06-01 ENCOUNTER — Encounter: Payer: Self-pay | Admitting: Nurse Practitioner

## 2018-06-01 DIAGNOSIS — R0602 Shortness of breath: Secondary | ICD-10-CM | POA: Insufficient documentation

## 2018-06-01 NOTE — Telephone Encounter (Signed)
Called the patient to let him know the blood work was not completed yesterday. The patient stated he may be able to come to the lab tomorrow (06/02/18) to have it done. Otherwise it will be on 06/06/18 before the appointment with Dr. Annamaria Boots.

## 2018-06-01 NOTE — Assessment & Plan Note (Signed)
Continue CPAP at current settings

## 2018-06-01 NOTE — Assessment & Plan Note (Addendum)
Patient Instructions  Spirometry checked in office today Will check chest x ray and labs and will call with results Will start prednisone taper Will start azithromycin Walked in office today - sats dropped to 86% Wear O2 at 2.5L with activity Continue anoro Continue pulmicort Continue combivent May try mucinex Take lasix daily until follow up Follow up in 1 week with Dr. Annamaria Boots Please call if symptoms worsen

## 2018-06-02 ENCOUNTER — Other Ambulatory Visit (INDEPENDENT_AMBULATORY_CARE_PROVIDER_SITE_OTHER): Payer: Medicare Other

## 2018-06-02 DIAGNOSIS — J441 Chronic obstructive pulmonary disease with (acute) exacerbation: Secondary | ICD-10-CM | POA: Diagnosis not present

## 2018-06-02 DIAGNOSIS — R0602 Shortness of breath: Secondary | ICD-10-CM

## 2018-06-02 LAB — BASIC METABOLIC PANEL
BUN: 13 mg/dL (ref 6–23)
CALCIUM: 9 mg/dL (ref 8.4–10.5)
CO2: 26 mEq/L (ref 19–32)
Chloride: 85 mEq/L — ABNORMAL LOW (ref 96–112)
Creatinine, Ser: 0.92 mg/dL (ref 0.40–1.50)
GFR: 85.57 mL/min (ref >60.00)
GLUCOSE: 147 mg/dL — AB (ref 70–99)
Potassium: 4.6 mEq/L (ref 3.5–5.1)
SODIUM: 121 meq/L — AB (ref 135–145)

## 2018-06-02 LAB — CBC WITH DIFFERENTIAL/PLATELET
BASOS ABS: 0 10*3/uL (ref 0.0–0.1)
Basophils Relative: 0.3 % (ref 0.0–3.0)
Eosinophils Absolute: 0 10*3/uL (ref 0.0–0.7)
Eosinophils Relative: 0.1 % (ref 0.0–5.0)
HCT: 40.3 % (ref 39.0–52.0)
HEMOGLOBIN: 14 g/dL (ref 13.0–17.0)
LYMPHS ABS: 0.8 10*3/uL (ref 0.7–4.0)
Lymphocytes Relative: 7 % — ABNORMAL LOW (ref 12.0–46.0)
MCHC: 34.6 g/dL (ref 30.0–36.0)
MCV: 91.9 fl (ref 78.0–100.0)
MONO ABS: 0.5 10*3/uL (ref 0.1–1.0)
MONOS PCT: 4.5 % (ref 3.0–12.0)
NEUTROS PCT: 88.1 % — AB (ref 43.0–77.0)
Neutro Abs: 9.5 10*3/uL — ABNORMAL HIGH (ref 1.4–7.7)
Platelets: 300 10*3/uL (ref 150.0–400.0)
RBC: 4.39 Mil/uL (ref 4.22–5.81)
RDW: 13.8 % (ref 11.5–15.5)
WBC: 10.8 10*3/uL — AB (ref 4.0–10.5)

## 2018-06-02 LAB — BRAIN NATRIURETIC PEPTIDE: Pro B Natriuretic peptide (BNP): 81 pg/mL (ref 0.0–100.0)

## 2018-06-03 ENCOUNTER — Telehealth: Payer: Self-pay | Admitting: Nurse Practitioner

## 2018-06-03 NOTE — Telephone Encounter (Signed)
Spoke with patient-aware this his Sodium level 1 day ago was 121. Pt noted and will make sure his PCP is aware this morning as well. Nothing further needed at this time.

## 2018-06-06 ENCOUNTER — Encounter: Payer: Self-pay | Admitting: Internal Medicine

## 2018-06-06 ENCOUNTER — Ambulatory Visit (INDEPENDENT_AMBULATORY_CARE_PROVIDER_SITE_OTHER): Payer: Medicare Other | Admitting: Internal Medicine

## 2018-06-06 DIAGNOSIS — G4733 Obstructive sleep apnea (adult) (pediatric): Secondary | ICD-10-CM | POA: Diagnosis not present

## 2018-06-06 DIAGNOSIS — J9611 Chronic respiratory failure with hypoxia: Secondary | ICD-10-CM | POA: Diagnosis not present

## 2018-06-06 DIAGNOSIS — J449 Chronic obstructive pulmonary disease, unspecified: Secondary | ICD-10-CM

## 2018-06-06 NOTE — Progress Notes (Signed)
Patient ID: Gerald [name], male    DOB: 03/13/1946, 72 y.o.   MRN: [id number]  HPI Male former heavy smoker followed for COPD complicated by CAD PFT 00/86/7619[name]- very severe obstructive airways disease with insignificant response to bronchodilator. FVC 2.97/76%, FEV1 0.83/77%, ratio 0.28, TLC 115%, DLCO 52% Walk Test on room air 12/18/2016-92% at rest, 93%, 94%, 93% walking 3 laps at brisk pace  ------------------------------------------------------------------------------------------------------  12/22/17- 55 year-old male former smoker followed for COPD, hypoxic respiratory failure, complicated by CAD, OSA( [place]) CPAP 8/ [facility]    Also O2 2L used prn- home concentrator ----COPD mixed type: Pt states he has the usual breathing issues-cough and SOB at times. Pt would like to discuss Trelegy vs nebulizer medications and if able to use one over the other.  Neb Perforomist/budesonide 0.5, Combivent Respimat,, Anoro,  Several weeks ago got steroid inj and taper for bronchitic flare, no abx- worked well. Today feeling well. Using both Trelegy and neb budesonide he had easy bruising which resolved when he changed to neb once daily and quit Trelegy.  Continues CPAP managed by his PCP.   06/06/2018- 58 year-old male former smoker followed for COPD, hypoxic respiratory failure, complicated by CAD, OSA( [place]) CPAP 8/ [facility]    Also O2 2L used prn- home concentrator Returns for follow-up after acute visit for sinusitis  Combivent Respimat, Anoro Ellipta, nebulizer Pulmicort Has 2 days left of prednisone from taper.  Feeling much better.  Continues nebulizer treatment twice daily and is interested in getting a small portable nebulizer device because medication delivery is much better than with metered inhaler. He will get high-dose flu vaccine from his PCP. CXR 05/31/2018 IMPRESSION: No edema or consolidation. Areas of scattered scarring, stable. Lungs hyperexpanded.  Stable cardiac silhouette. There is aortic atherosclerosis. Aortic Atherosclerosis (ICD10-I70.0).  Review of Systems- See HPI    + = positive Constitutional:   No weight loss, night sweats,  Fevers, chills, fatigue, lassitude. HEENT:   No headaches,  Difficulty swallowing,  Tooth/dental problems,  Sore throat,                No sneezing, itching, ear ache,                 nasal congestion, post nasal drip,  CV:  No chest pain,  +Orthopnea, PND, , anasarca, dizziness, palpitations GI  No heartburn, indigestion, abdominal pain, nausea, vomiting, diarrhea,  Resp:.   No change in color of mucus.  No wheezing. Skin: no rash or lesions. GU: . MS:  No joint pain or swelling.  No decreased range of motion.  Psych:  No change in mood or affect. No depression or anxiety.  No memory loss.   Objective:   Physical Exam General- Alert, Oriented, Affect-appropriate, Distress- none acute,  Skin- rash-none, lesions- none, excoriation- none Lymphadenopathy- none Head- atraumatic            Eyes- Gross vision intact, PERRLA, conjunctivae clear secretions            Ears- Hearing aids            Nose- Clear, no-Septal dev, mucus, polyps, erosion, perforation             Throat- Mallampati II-III , mucosa clear , drainage- none, tonsils- atrophic,  Neck- flexible , trachea midline, no stridor , thyroid nl, carotid no bruit Chest - symmetrical excursion , unlabored           Heart/CV- RRR , no murmur , no  gallop  , no rub, nl s1 s2                           - JVD- none , edema- none, stasis changes- none, varices- none           Lung- +distant/Clear , unlabored, wheeze-none, cough- none , dullness-none, rub-                                 none.            Chest wall-  Abd-  Br/ Gen/ Rectal- Not done, not indicated Extrem- cyanosis- none, clubbing, none, atrophy- none, strength- nl Neuro- grossly intact to observation

## 2018-06-06 NOTE — Patient Instructions (Signed)
Get high dose/ senior flu vaccine in next few weeks  Finish the prednisone taper as planned.   Please call as needed

## 2018-06-07 ENCOUNTER — Telehealth: Payer: Self-pay | Admitting: Internal Medicine

## 2018-06-07 DIAGNOSIS — J441 Chronic obstructive pulmonary disease with (acute) exacerbation: Secondary | ICD-10-CM

## 2018-06-07 DIAGNOSIS — J449 Chronic obstructive pulmonary disease, unspecified: Secondary | ICD-10-CM | POA: Insufficient documentation

## 2018-06-07 DIAGNOSIS — J9611 Chronic respiratory failure with hypoxia: Secondary | ICD-10-CM | POA: Insufficient documentation

## 2018-06-07 NOTE — Telephone Encounter (Signed)
Either print and mail, or order through his DME:  Portable compressor nebulizer, miscellaneous, for home use and travel     Dx COPD mixed type

## 2018-06-07 NOTE — Telephone Encounter (Signed)
Patient is aware nothing further is needed.

## 2018-06-07 NOTE — Assessment & Plan Note (Signed)
He continues to benefit from CPAP with compliant use and good control Plan-continue CPAP 8

## 2018-06-07 NOTE — Assessment & Plan Note (Addendum)
Recent exacerbation has resolved.  He continues Anoro inhaler but I agree he may do better depending more on a nebulizer machine because his airflow is poor. Plan-portable nebulizer machine for use with travel and away from home.  Continue budesonide/Perforomist

## 2018-06-07 NOTE — Telephone Encounter (Signed)
Spoke with pt. He is requesting a prescription for a portable nebulizer machine.  CY - please advise if you are okay with this. Thanks.

## 2018-06-07 NOTE — Assessment & Plan Note (Signed)
He is beginning to notice more constant need for oxygen especially with activity.  Discussed oxygen therapy recommending goal saturation between 90 and 94%.  He does have oximeter.

## 2018-06-15 ENCOUNTER — Telehealth: Payer: Self-pay | Admitting: Internal Medicine

## 2018-06-15 NOTE — Telephone Encounter (Signed)
Called and spoke to [facility], unable to locate order, she is going to follow up with Marlowe Kays and have Marlowe Kays give [place] a call back tomorrow.

## 2018-06-16 ENCOUNTER — Encounter (HOSPITAL_COMMUNITY): Payer: Self-pay | Admitting: Internal Medicine

## 2018-06-16 ENCOUNTER — Inpatient Hospital Stay (HOSPITAL_COMMUNITY)
Admission: AD | Admit: 2018-06-16 | Discharge: 2018-06-19 | DRG: 643 | Disposition: A | Discharge status: Home Health | Payer: Medicare Other | Source: Other Acute Inpatient Hospital | Attending: Internal Medicine | Admitting: Internal Medicine

## 2018-06-16 DIAGNOSIS — R911 Solitary pulmonary nodule: Secondary | ICD-10-CM | POA: Diagnosis present

## 2018-06-16 DIAGNOSIS — E119 Type 2 diabetes mellitus without complications: Secondary | ICD-10-CM | POA: Diagnosis not present

## 2018-06-16 DIAGNOSIS — K219 Gastro-esophageal reflux disease without esophagitis: Secondary | ICD-10-CM | POA: Diagnosis present

## 2018-06-16 DIAGNOSIS — R0602 Shortness of breath: Secondary | ICD-10-CM

## 2018-06-16 DIAGNOSIS — I110 Hypertensive heart disease with heart failure: Secondary | ICD-10-CM | POA: Diagnosis present

## 2018-06-16 DIAGNOSIS — Z85528 Personal history of other malignant neoplasm of kidney: Secondary | ICD-10-CM

## 2018-06-16 DIAGNOSIS — J449 Chronic obstructive pulmonary disease, unspecified: Secondary | ICD-10-CM | POA: Diagnosis present

## 2018-06-16 DIAGNOSIS — Z8551 Personal history of malignant neoplasm of bladder: Secondary | ICD-10-CM

## 2018-06-16 DIAGNOSIS — Z955 Presence of coronary angioplasty implant and graft: Secondary | ICD-10-CM

## 2018-06-16 DIAGNOSIS — J9611 Chronic respiratory failure with hypoxia: Secondary | ICD-10-CM | POA: Diagnosis not present

## 2018-06-16 DIAGNOSIS — Z7983 Long term (current) use of bisphosphonates: Secondary | ICD-10-CM

## 2018-06-16 DIAGNOSIS — Z7984 Long term (current) use of oral hypoglycemic drugs: Secondary | ICD-10-CM

## 2018-06-16 DIAGNOSIS — Z9981 Dependence on supplemental oxygen: Secondary | ICD-10-CM

## 2018-06-16 DIAGNOSIS — Z8249 Family history of ischemic heart disease and other diseases of the circulatory system: Secondary | ICD-10-CM

## 2018-06-16 DIAGNOSIS — E871 Hypo-osmolality and hyponatremia: Secondary | ICD-10-CM | POA: Diagnosis present

## 2018-06-16 DIAGNOSIS — Z905 Acquired absence of kidney: Secondary | ICD-10-CM

## 2018-06-16 DIAGNOSIS — Z87891 Personal history of nicotine dependence: Secondary | ICD-10-CM

## 2018-06-16 DIAGNOSIS — I2510 Atherosclerotic heart disease of native coronary artery without angina pectoris: Secondary | ICD-10-CM | POA: Diagnosis present

## 2018-06-16 DIAGNOSIS — E785 Hyperlipidemia, unspecified: Secondary | ICD-10-CM | POA: Diagnosis present

## 2018-06-16 DIAGNOSIS — E222 Syndrome of inappropriate secretion of antidiuretic hormone: Principal | ICD-10-CM | POA: Diagnosis present

## 2018-06-16 DIAGNOSIS — I252 Old myocardial infarction: Secondary | ICD-10-CM

## 2018-06-16 DIAGNOSIS — G4733 Obstructive sleep apnea (adult) (pediatric): Secondary | ICD-10-CM | POA: Diagnosis present

## 2018-06-16 DIAGNOSIS — I5033 Acute on chronic diastolic (congestive) heart failure: Secondary | ICD-10-CM | POA: Diagnosis present

## 2018-06-16 DIAGNOSIS — Z7982 Long term (current) use of aspirin: Secondary | ICD-10-CM

## 2018-06-16 LAB — GLUCOSE, CAPILLARY: GLUCOSE-CAPILLARY: 121 mg/dL — AB (ref 70–99)

## 2018-06-16 MED ORDER — ZOLPIDEM TARTRATE 5 MG PO TABS: 5.0000 mg | ORAL_TABLET | Freq: Every evening | ORAL | Status: DC | PRN

## 2018-06-16 MED ORDER — ACETAMINOPHEN 650 MG RE SUPP: 650.0000 mg | Freq: Four times a day (QID) | RECTAL | Status: DC | PRN

## 2018-06-16 MED ORDER — INSULIN ASPART 100 UNIT/ML ~~LOC~~ SOLN: 0.0000 [IU] | Freq: Every day | SUBCUTANEOUS | Status: DC

## 2018-06-16 MED ORDER — FLUTICASONE PROPIONATE 50 MCG/ACT NA SUSP
2.0000 | Freq: Every day | NASAL | Status: DC
  Administered 2018-06-17 – 2018-06-19 (×3): 2 via NASAL
  Filled 2018-06-16: qty 16

## 2018-06-16 MED ORDER — SENNOSIDES-DOCUSATE SODIUM 8.6-50 MG PO TABS: 1.0000 | ORAL_TABLET | Freq: Every evening | ORAL | Status: DC | PRN

## 2018-06-16 MED ORDER — PANTOPRAZOLE SODIUM 40 MG PO TBEC
40.0000 mg | DELAYED_RELEASE_TABLET | Freq: Every day | ORAL | Status: DC
  Administered 2018-06-17 – 2018-06-19 (×3): 40 mg via ORAL
  Filled 2018-06-16 (×3): qty 1

## 2018-06-16 MED ORDER — ASPIRIN EC 81 MG PO TBEC
81.0000 mg | DELAYED_RELEASE_TABLET | Freq: Every day | ORAL | Status: DC
  Administered 2018-06-16 – 2018-06-19 (×4): 81 mg via ORAL
  Filled 2018-06-16 (×4): qty 1

## 2018-06-16 MED ORDER — HEPARIN SODIUM (PORCINE) 5000 UNIT/ML IJ SOLN
5000.0000 [IU] | Freq: Three times a day (TID) | INTRAMUSCULAR | Status: DC
  Administered 2018-06-16 – 2018-06-19 (×8): 5000 [IU] via SUBCUTANEOUS
  Filled 2018-06-16 (×8): qty 1

## 2018-06-16 MED ORDER — ONDANSETRON HCL 4 MG/2ML IJ SOLN: 4.0000 mg | Freq: Four times a day (QID) | INTRAMUSCULAR | Status: DC | PRN

## 2018-06-16 MED ORDER — TRAZODONE HCL 50 MG PO TABS
50.0000 mg | ORAL_TABLET | Freq: Every day | ORAL | Status: DC
  Administered 2018-06-16: 50 mg via ORAL
  Filled 2018-06-16: qty 1

## 2018-06-16 MED ORDER — HYDRALAZINE HCL 20 MG/ML IJ SOLN: 5.0000 mg | INTRAMUSCULAR | Status: DC | PRN

## 2018-06-16 MED ORDER — DM-GUAIFENESIN ER 30-600 MG PO TB12: 1.0000 | ORAL_TABLET | Freq: Two times a day (BID) | ORAL | Status: DC | PRN

## 2018-06-16 MED ORDER — INSULIN ASPART 100 UNIT/ML ~~LOC~~ SOLN
0.0000 [IU] | Freq: Three times a day (TID) | SUBCUTANEOUS | Status: DC
  Administered 2018-06-17 (×2): 2 [IU] via SUBCUTANEOUS
  Administered 2018-06-17: 3 [IU] via SUBCUTANEOUS
  Administered 2018-06-18: 2 [IU] via SUBCUTANEOUS
  Administered 2018-06-18: 3 [IU] via SUBCUTANEOUS
  Administered 2018-06-19: 1 [IU] via SUBCUTANEOUS

## 2018-06-16 MED ORDER — AMLODIPINE BESYLATE 5 MG PO TABS
5.0000 mg | ORAL_TABLET | Freq: Every day | ORAL | Status: DC
  Administered 2018-06-16 – 2018-06-17 (×2): 5 mg via ORAL
  Filled 2018-06-16 (×2): qty 1

## 2018-06-16 MED ORDER — ALBUTEROL SULFATE (2.5 MG/3ML) 0.083% IN NEBU: 2.5000 mg | INHALATION_SOLUTION | RESPIRATORY_TRACT | Status: DC | PRN

## 2018-06-16 MED ORDER — ROSUVASTATIN CALCIUM 20 MG PO TABS
40.0000 mg | ORAL_TABLET | Freq: Every day | ORAL | Status: DC
  Administered 2018-06-17 – 2018-06-18 (×2): 40 mg via ORAL
  Filled 2018-06-16 (×2): qty 2

## 2018-06-16 MED ORDER — IPRATROPIUM-ALBUTEROL 0.5-2.5 (3) MG/3ML IN SOLN
3.0000 mL | Freq: Four times a day (QID) | RESPIRATORY_TRACT | Status: DC
  Administered 2018-06-16: 3 mL via RESPIRATORY_TRACT
  Filled 2018-06-16: qty 3

## 2018-06-16 MED ORDER — ACETAMINOPHEN 325 MG PO TABS: 650.0000 mg | ORAL_TABLET | Freq: Four times a day (QID) | ORAL | Status: DC | PRN

## 2018-06-16 MED ORDER — ONDANSETRON HCL 4 MG PO TABS: 4.0000 mg | ORAL_TABLET | Freq: Four times a day (QID) | ORAL | Status: DC | PRN

## 2018-06-16 NOTE — Telephone Encounter (Signed)
Pt is still admitted to [facility] but got approval to transfer to [facility]. Pt would like to be sure this is ok with Dr. Annamaria Boots. Cb is [phone].

## 2018-06-16 NOTE — Telephone Encounter (Signed)
Pt is calling back [phone]

## 2018-06-16 NOTE — H&P (Addendum)
History and Physical    ARLYN BUERKLE SEG:[id number] DOB: [birth year] DOA: 06/16/2018  Referring MD/NP/PA:   PCP: Olena Mater, MD   Patient coming from:  The patient is coming from home.  At baseline, pt is independent for most of ADL.   Chief Complaint: hyponatremia  HPI: HALBERT JESSON is a 72 y.o. male with medical history significant of hypertension, hyperlipidemia, diabetes mellitus, COPD on 2 L nasal cannula oxygen, GERD, OSA on CPAP, CAD, stent placement, kidney cancer (s/p of right nephrectomy per pt's report), bladder cancer (treated with BCG, followed by Dr. Lerry Liner of urology in [place]), who is transfered from [facility] due to hyponatremia.  Pt states that he was found to have low blood sodium by his pulmonologist Dr. Annamaria Boots three weeks ago. His Lasix was discontinued by his primary care doctor.  And the repeated sodium still low.  He was sent to [facility] for further evaluation treatment on 06/14/18. He was found to have hyponatremia with sodium of 114 initially.  Nephrologist was consulted, who recommended giving hypertonic saline. His sodium went up to 121 and then back down to 119. Then single dose of Volvaptan was given today. His sodium was 120 at 15:54-->120 at 16:30 today.  Urine sodium was 176, UOsm 640, plasma Osm 244. He was diagnosed as SIADH. Pt had CT chest which showed a 5 mm nodule and subcarinal lymphadenopathy, concerning for malignancy.  Patient states that he has mild shortness of breath and mild cough due to COPD, which are at her baseline.  No other new symptoms.  No chest pain, shortness breath, fever, chills.  No nausea vomiting, diarrhea or abdominal pain.  No symptoms of UTI or unilateral weakness.  No confusion or seizure.  CT-chest without contrast: 1.  Mediastinal adenopathy in peritracheal/precarina area, lateral AP window and subcarinal areas.  The largest lymph node is in the subcarinal region, 18 mm in short axis dimension. 2.  Satellite appearing 5 mm nodule in the anterolateral right upper lobe 3.  Patchy opacity with bronchitic type pattern and possible tree in bud pattern involving the lateral left upper lobe. 4.  Multilevel mild compression fracture deformities in the mid thoracic spine suggesting chronic fracture.  Pt was found to have WBC 12.6, lactic acid 1.9, INR 1.0, PTT 30.7, negative troponin, pending vitamin D level, creatinine normal, triglycerides 69, phosphorus 1.9, calcium 7.4. Pt is placed on SUD for obs. Renal Dr. Joelyn Oms was consulted.  Review of Systems:   General: no fevers, chills, no body weight gain, fatigue HEENT: no blurry vision, hearing changes or sore throat Respiratory: has dyspnea, coughing, no wheezing CV: no chest pain, no palpitations GI: no nausea, vomiting, abdominal pain, diarrhea, constipation GU: no dysuria, burning on urination, increased urinary frequency, hematuria  Ext: no leg edema Neuro: no unilateral weakness, numbness, or tingling, no vision change or hearing loss Skin: no rash, no skin tear. MSK: No muscle spasm, no deformity, no limitation of range of movement in spin Heme: No easy bruising.  Travel history: No recent long distant travel.  Allergy: No Known Allergies  Past Medical History:  Diagnosis Date  . Bladder cancer (HCC)    BCG treatments  . CAD (coronary artery disease)   . COPD (chronic obstructive pulmonary disease) ([place])   . Heart attack ([place])   . Kidney disease     Past Surgical History:  Procedure Laterality Date  . CORONARY STENT PLACEMENT    . KIDNEY SURGERY  06-2009  right nephrectomy 2009 for bladder Ca    Social History:  reports that he quit smoking about 9 years ago. His smoking use included cigarettes. He has a 50.00 pack-year smoking history. He has never used smokeless tobacco. He reports that he does not drink alcohol or use drugs.  Family History:  Family History  Problem Relation Age of Onset  . Heart attack Mother 24         maternal side of family history     Prior to Admission medications   Medication Sig Start Date End Date Taking? Authorizing Provider  alendronate (FOSAMAX) 70 MG tablet Take 70 mg by mouth every 7 (seven) days.  01/02/11   [provider]  Celedonio Miyamoto 62.5-25 MCG/INH AEPB USE 1 INHALATION DAILY 05/02/18   Baird Lyons D, MD  aspirin 81 MG tablet Take 81 mg by mouth as needed.     [provider]  atorvastatin (LIPITOR) 80 MG tablet Take 80 mg by mouth daily.      [provider]  budesonide (PULMICORT) 0.5 MG/2ML nebulizer solution Take 2 mLs (0.5 mg total) by nebulization 2 (two) times daily. 08/31/14 06/06/18  Baird Lyons D, MD  Calcium Carbonate-Vitamin D (CALCIUM 600+D) 600-400 MG-UNIT per tablet Take 1 tablet by mouth daily.      [provider]  glipiZIDE (GLUCOTROL) 5 MG tablet Take 5 mg by mouth daily before breakfast.    [provider]  Ipratropium-Albuterol (COMBIVENT RESPIMAT) 20-100 MCG/ACT AERS respimat USE 1 INHALATION EVERY 6 HOURS AS NEEDED FOR WHEEZING OR SHORTNESS OF BREATH 03/02/18   Young, Tarri Fuller D, MD  losartan (COZAAR) 25 MG tablet Take 1 tablet by mouth daily. 11/04/14   [provider]  metFORMIN (GLUCOPHAGE) 500 MG tablet Take 1,000 mg by mouth 2 (two) times daily.  06/07/15   [provider]  omeprazole (PRILOSEC) 20 MG capsule Take 1 capsule by mouth daily. 04/26/15   [provider]  predniSONE (DELTASONE) 10 MG tablet Take 4 tabs for 2 days, then 3 tabs for 2 days, then 2 tabs for 2 days, then 1 tab for 2 days, then stop 05/31/18   Fenton Foy, NP  Respiratory Therapy Supplies (FLUTTER) DEVI Blow through 4 times per set, 3 sets per day 12/22/17   Baird Lyons D, MD  traZODone (DESYREL) 100 MG tablet  06/07/15   [provider]    Physical Exam: Vitals:   06/16/18 2036 06/16/18 2334 06/17/18 0025 06/17/18 0425  BP: 124/84  113/77 (!) 136/93  Pulse: (!) 113 99 (!) 107 (!)  110  Resp: 19 18 20 17   Temp: 98.5 F (36.9 C)     SpO2: 99% 94% (!) 89% 93%  Weight: 76.1 kg     Height: 5\' 9"  (1.753 m)      General: Not in acute distress HEENT:       Eyes: PERRL, EOMI, no scleral icterus.       ENT: No discharge from the ears and nose, no pharynx injection, no tonsillar enlargement.        Neck: No JVD, no bruit, no mass felt. Heme: No neck lymph node enlargement. Cardiac: S1/S2, RRR, No murmurs, No gallops or rubs. Respiratory: No rales, wheezing, rhonchi or rubs. GI: Soft, nondistended, nontender, no rebound pain, no organomegaly, BS present. GU: No hematuria Ext: No pitting leg edema bilaterally. 2+DP/PT pulse bilaterally. Musculoskeletal: No joint deformities, No joint redness or warmth, no limitation of ROM in spin. Skin: No rashes.  Neuro: Alert, oriented X3, cranial nerves II-XII grossly intact, moves all extremities normally.  Psych: Patient is not psychotic, no suicidal or hemocidal ideation.  Labs on Admission: I have personally reviewed following labs and imaging studies  CBC: Recent Labs  Lab 06/16/18 2200 06/17/18 0445  WBC 12.7* 12.4*  HGB 13.9 14.5  HCT 39.8 41.3  MCV 90.2 91.4  PLT 178 277   Basic Metabolic Panel: Recent Labs  Lab 06/16/18 2200 06/16/18 2326 06/17/18 0235 06/17/18 0445  NA 129*  --  132* 132*  K 3.8  --   --  4.7  CL 93*  --   --  95*  CO2 23  --   --  27  GLUCOSE 125*  --   --  191*  BUN 10  --   --  5*  CREATININE 0.92  --   --  1.07  CALCIUM 8.3*  --   --  8.4*  MG  --  1.5*  --   --   PHOS  --  1.8*  --   --    GFR: Estimated Creatinine Clearance: 61.5 mL/min (by C-G formula based on SCr of 1.07 mg/dL). Liver Function Tests: No results for input(s): AST, ALT, ALKPHOS, BILITOT, PROT, ALBUMIN in the last 168 hours. No results for input(s): LIPASE, AMYLASE in the last 168 hours. No results for input(s): AMMONIA in the last 168 hours. Coagulation Profile: Recent Labs  Lab 06/17/18 0445  INR 1.08    Cardiac Enzymes: No results for input(s): CKTOTAL, CKMB, CKMBINDEX, TROPONINI in the last 168 hours. BNP (last 3 results) Recent Labs    06/02/18 1126  PROBNP 81.0   HbA1C: No results for input(s): HGBA1C in the last 72 hours. CBG: Recent Labs  Lab 06/16/18 2225  GLUCAP 121*   Lipid Profile: No results for input(s): CHOL, HDL, LDLCALC, TRIG, CHOLHDL, LDLDIRECT in the last 72 hours. Thyroid Function Tests: No results for input(s): TSH, T4TOTAL, FREET4, T3FREE, THYROIDAB in the last 72 hours. Anemia Panel: No results for input(s): VITAMINB12, FOLATE, FERRITIN, TIBC, IRON, RETICCTPCT in the last 72 hours. Urine analysis: No results found for: COLORURINE, APPEARANCEUR, LABSPEC, PHURINE, GLUCOSEU, HGBUR, BILIRUBINUR, KETONESUR, PROTEINUR, UROBILINOGEN, NITRITE, LEUKOCYTESUR Sepsis Labs: @LABRCNTIP (procalcitonin:4,lacticidven:4) ) Recent Results (from the past 240 hour(s))  MRSA PCR Screening     Status: None   Collection Time: 06/17/18  1:51 AM  Result Value Ref Range Status   MRSA by PCR NEGATIVE NEGATIVE Final    Comment:        The GeneXpert MRSA Assay (FDA approved for NASAL specimens only), is one component of a comprehensive MRSA colonization surveillance program. It is not intended to diagnose MRSA infection nor to guide or monitor treatment for MRSA infections. Performed at [facility], [place] [address] [place], [place] [address]      Radiological Exams on Admission: No results found.   EKG done in [place]: Independently reviewed.  Sinus rhythm, QTC 442, low voltage, nonspecific T-wave change.   Assessment/Plan Principal Problem:   Hyponatremia Active Problems:   CAD (coronary artery disease)   Obstructive sleep apnea   Chronic respiratory failure with hypoxia (HCC)   COPD (chronic obstructive pulmonary disease) (HCC)   SIADH (syndrome of inappropriate ADH production) (HCC)   Diabetes mellitus without complication (HCC)   HLD  (hyperlipidemia)   GERD (gastroesophageal reflux disease)   Lung nodule   Hypophosphatemia   Hypomagnesemia   Hyponatremia due to SIADH: recent Na was 120  after treated with hypertonic saline and Volvaptan.  CT of the chest showed pulmonary nodule and lymphadenopathy, indicating possible malignancy. Patient is asymptomatic. Renal, Dr. Joelyn Oms is consulted--> recommended to put the patient on 1000 ml of fluid restriction, repeat urine Osm and plasma Osma. His TSH was 0.77 on 06/15/18  -will place on SDU for obs - Will check urine sodium, urine osmolality, serum osmolality. - Fluid restriction to 1000 ml - Hold [place] - f/u by BMP q6h  Addendum:  1.  the repeated BMP at 22: 00 showed Na 129 (Na was 120 at 16:30). Correction is too fast after Volvaptan. Dr. Joelyn Oms started pt with D5W at 150 cc/h and f/u Na level q2hour. D/c'ed fluid restriction. The repeated Na is 130 at 2:35, will increase d5W to 200 cc/h.  2. Hypophosphatemia and Hypomagnesemia: Mg 1.5 and phosphorus 1.8 -Potassium phosphate 500 mg x 2 orally -Magnesium sulfate 2 g IV  CAD (coronary artery disease): s/p of stent. No CP. -continue crestor and ASA  Obstructive sleep apnea: -CpAP  Chronic respiratory failure with hypoxia due to COPD: stable -DuoNeb nebulizers, PRN albuterol nebulizers,  -PRN Mucinex  Diabetes mellitus without complication ([place]): Last A1c not record. Patient is taking glipizide and metformin at home -SSI  HLD: -Crestor  GERD: -Protonix  Lung nodule: per discharge summary, pulmonologist was consulted, Dr. Luciana Axe recommended biopsy of subcarinal lymphadenopathy when his sodium is stabilized.  Actually patient has a pulmonologist, Dr. Annamaria Boots in [place].  -follow up with Dr. Annamaria Boots  DVT ppx: SQ Heparin Code Status: Full code Family Communication: None at bed side.      Disposition Plan:  Anticipate discharge back to previous home environment Consults called: renal, Dr. Joelyn Oms Admission  status:   SDU/obs      Date of Service 06/17/2018    Ivor Costa [facility] Pager [phone]  If 7PM-7AM, please contact night-coverage [email or web address] Password [email or web address] 06/17/2018, 6:21 AM

## 2018-06-16 NOTE — Telephone Encounter (Signed)
Correction- he is currently at [facility] and was thinking he would go home but they advised him he should stay for right now. He told me he would contact [facility] on Monday for a status update.

## 2018-06-16 NOTE — Telephone Encounter (Signed)
Called and spoke with pt who stated he is going to be transferred from [facility] to [facility] as the approval for the transfer was done.  Stated to pt I would let Dr.Young know this and also advised him to get records from the hospital stay at [facility] so Dr. Annamaria Boots could be able to look at those records.  Routing to Dr. Annamaria Boots as an FYI of the hospital transfer approval for pt.

## 2018-06-16 NOTE — Telephone Encounter (Signed)
I signed form for new nebulizer machine. Which hospital was he in? If [facility] in [place], please call there for discharge summary for our records.

## 2018-06-16 NOTE — Telephone Encounter (Signed)
Spoke with pt, advised we are waiting on a return call from [facility]. He has been in the hospital since Wednesday for low sodium. He is also having more lung problems and had a CT there at hospital. Salena Saner

## 2018-06-17 ENCOUNTER — Observation Stay (HOSPITAL_COMMUNITY): Payer: Medicare Other

## 2018-06-17 DIAGNOSIS — I110 Hypertensive heart disease with heart failure: Secondary | ICD-10-CM | POA: Diagnosis present

## 2018-06-17 DIAGNOSIS — Z7982 Long term (current) use of aspirin: Secondary | ICD-10-CM | POA: Diagnosis not present

## 2018-06-17 DIAGNOSIS — Z7984 Long term (current) use of oral hypoglycemic drugs: Secondary | ICD-10-CM | POA: Diagnosis not present

## 2018-06-17 DIAGNOSIS — E119 Type 2 diabetes mellitus without complications: Secondary | ICD-10-CM | POA: Diagnosis present

## 2018-06-17 DIAGNOSIS — I5033 Acute on chronic diastolic (congestive) heart failure: Secondary | ICD-10-CM | POA: Diagnosis present

## 2018-06-17 DIAGNOSIS — Z87891 Personal history of nicotine dependence: Secondary | ICD-10-CM | POA: Diagnosis not present

## 2018-06-17 DIAGNOSIS — Z7983 Long term (current) use of bisphosphonates: Secondary | ICD-10-CM | POA: Diagnosis not present

## 2018-06-17 DIAGNOSIS — J449 Chronic obstructive pulmonary disease, unspecified: Secondary | ICD-10-CM | POA: Diagnosis present

## 2018-06-17 DIAGNOSIS — Z8249 Family history of ischemic heart disease and other diseases of the circulatory system: Secondary | ICD-10-CM | POA: Diagnosis not present

## 2018-06-17 DIAGNOSIS — I252 Old myocardial infarction: Secondary | ICD-10-CM | POA: Diagnosis not present

## 2018-06-17 DIAGNOSIS — G4733 Obstructive sleep apnea (adult) (pediatric): Secondary | ICD-10-CM | POA: Diagnosis present

## 2018-06-17 DIAGNOSIS — E785 Hyperlipidemia, unspecified: Secondary | ICD-10-CM | POA: Diagnosis present

## 2018-06-17 DIAGNOSIS — I2510 Atherosclerotic heart disease of native coronary artery without angina pectoris: Secondary | ICD-10-CM | POA: Diagnosis present

## 2018-06-17 DIAGNOSIS — R0602 Shortness of breath: Secondary | ICD-10-CM | POA: Diagnosis not present

## 2018-06-17 DIAGNOSIS — K219 Gastro-esophageal reflux disease without esophagitis: Secondary | ICD-10-CM | POA: Diagnosis present

## 2018-06-17 DIAGNOSIS — Z955 Presence of coronary angioplasty implant and graft: Secondary | ICD-10-CM | POA: Diagnosis not present

## 2018-06-17 DIAGNOSIS — J9611 Chronic respiratory failure with hypoxia: Secondary | ICD-10-CM | POA: Diagnosis present

## 2018-06-17 DIAGNOSIS — Z8551 Personal history of malignant neoplasm of bladder: Secondary | ICD-10-CM | POA: Diagnosis not present

## 2018-06-17 DIAGNOSIS — Z9981 Dependence on supplemental oxygen: Secondary | ICD-10-CM | POA: Diagnosis not present

## 2018-06-17 DIAGNOSIS — Z85528 Personal history of other malignant neoplasm of kidney: Secondary | ICD-10-CM | POA: Diagnosis not present

## 2018-06-17 DIAGNOSIS — E871 Hypo-osmolality and hyponatremia: Secondary | ICD-10-CM | POA: Diagnosis not present

## 2018-06-17 DIAGNOSIS — E222 Syndrome of inappropriate secretion of antidiuretic hormone: Secondary | ICD-10-CM | POA: Diagnosis present

## 2018-06-17 DIAGNOSIS — Z905 Acquired absence of kidney: Secondary | ICD-10-CM | POA: Diagnosis not present

## 2018-06-17 LAB — SODIUM, URINE, RANDOM

## 2018-06-17 LAB — OSMOLALITY, URINE: Osmolality, Ur: 92 mOsm/kg — ABNORMAL LOW (ref 300–900)

## 2018-06-17 LAB — TROPONIN I: Troponin I: <0.03 ng/mL (ref <0.03)

## 2018-06-17 LAB — BASIC METABOLIC PANEL
ANION GAP: 13 (ref 5–15)
Anion gap: 10 (ref 5–15)
BUN: 10 mg/dL (ref 8–23)
BUN: 5 mg/dL — AB (ref 8–23)
CHLORIDE: 93 mmol/L — AB (ref 98–111)
CO2: 23 mmol/L (ref 22–32)
CO2: 27 mmol/L (ref 22–32)
Calcium: 8.3 mg/dL — ABNORMAL LOW (ref 8.9–10.3)
Calcium: 8.4 mg/dL — ABNORMAL LOW (ref 8.9–10.3)
Chloride: 95 mmol/L — ABNORMAL LOW (ref 98–111)
Creatinine, Ser: 0.92 mg/dL (ref 0.61–1.24)
Creatinine, Ser: 1.07 mg/dL (ref 0.61–1.24)
GFR calc Af Amer: >60 mL/min (ref >60)
GLUCOSE: 191 mg/dL — AB (ref 70–99)
Glucose, Bld: 125 mg/dL — ABNORMAL HIGH (ref 70–99)
POTASSIUM: 4.7 mmol/L (ref 3.5–5.1)
Potassium: 3.8 mmol/L (ref 3.5–5.1)
SODIUM: 129 mmol/L — AB (ref 135–145)
Sodium: 132 mmol/L — ABNORMAL LOW (ref 135–145)

## 2018-06-17 LAB — CREATININE, URINE, RANDOM: Creatinine, Urine: 30.3 mg/dL

## 2018-06-17 LAB — SODIUM
SODIUM: 132 mmol/L — AB (ref 135–145)
SODIUM: 132 mmol/L — AB (ref 135–145)
Sodium: 130 mmol/L — ABNORMAL LOW (ref 135–145)
Sodium: 131 mmol/L — ABNORMAL LOW (ref 135–145)
Sodium: 132 mmol/L — ABNORMAL LOW (ref 135–145)

## 2018-06-17 LAB — GLUCOSE, CAPILLARY
GLUCOSE-CAPILLARY: 170 mg/dL — AB (ref 70–99)
Glucose-Capillary: 180 mg/dL — ABNORMAL HIGH (ref 70–99)
Glucose-Capillary: 235 mg/dL — ABNORMAL HIGH (ref 70–99)
Glucose-Capillary: 116 mg/dL — ABNORMAL HIGH (ref 70–99)

## 2018-06-17 LAB — CBC
HCT: 41.3 % (ref 39.0–52.0)
HEMATOCRIT: 39.8 % (ref 39.0–52.0)
HEMOGLOBIN: 14.5 g/dL (ref 13.0–17.0)
Hemoglobin: 13.9 g/dL (ref 13.0–17.0)
MCH: 31.5 pg (ref 26.0–34.0)
MCH: 32.1 pg (ref 26.0–34.0)
MCHC: 34.9 g/dL (ref 30.0–36.0)
MCHC: 35.1 g/dL (ref 30.0–36.0)
MCV: 90.2 fL (ref 78.0–100.0)
MCV: 91.4 fL (ref 78.0–100.0)
Platelets: 178 10*3/uL (ref 150–400)
Platelets: 189 10*3/uL (ref 150–400)
RBC: 4.41 MIL/uL (ref 4.22–5.81)
RBC: 4.52 MIL/uL (ref 4.22–5.81)
RDW: 12.4 % (ref 11.5–15.5)
RDW: 12.8 % (ref 11.5–15.5)
WBC: 12.7 10*3/uL — ABNORMAL HIGH (ref 4.0–10.5)
WBC: 12.4 10*3/uL — ABNORMAL HIGH (ref 4.0–10.5)

## 2018-06-17 LAB — OSMOLALITY: Osmolality: 265 mOsm/kg — ABNORMAL LOW (ref 275–295)

## 2018-06-17 LAB — TSH: TSH: 0.51 u[IU]/mL (ref 0.350–4.500)

## 2018-06-17 LAB — ECHOCARDIOGRAM COMPLETE
Height: 69 in
WEIGHTICAEL: 2684.32 [oz_av]

## 2018-06-17 LAB — MAGNESIUM
MAGNESIUM: 1.5 mg/dL — AB (ref 1.7–2.4)
MAGNESIUM: 2.6 mg/dL — AB (ref 1.7–2.4)

## 2018-06-17 LAB — PHOSPHORUS: Phosphorus: 1.8 mg/dL — ABNORMAL LOW (ref 2.5–4.6)

## 2018-06-17 LAB — MRSA PCR SCREENING: MRSA BY PCR: NEGATIVE

## 2018-06-17 LAB — PROTIME-INR
INR: 1.08
PROTHROMBIN TIME: 14 s (ref 11.4–15.2)

## 2018-06-17 LAB — BRAIN NATRIURETIC PEPTIDE
B Natriuretic Peptide: 40.5 pg/mL (ref 0.0–100.0)
B Natriuretic Peptide: 60.6 pg/mL (ref 0.0–100.0)

## 2018-06-17 MED ORDER — K PHOS MONO-SOD PHOS DI & MONO 155-852-130 MG PO TABS
500.0000 mg | ORAL_TABLET | Freq: Two times a day (BID) | ORAL | Status: AC
  Administered 2018-06-17 (×2): 500 mg via ORAL
  Filled 2018-06-17 (×2): qty 2

## 2018-06-17 MED ORDER — METOPROLOL TARTRATE 5 MG/5ML IV SOLN
5.0000 mg | INTRAVENOUS | Status: DC | PRN
  Administered 2018-06-17: 5 mg via INTRAVENOUS
  Filled 2018-06-17: qty 5

## 2018-06-17 MED ORDER — METOPROLOL TARTRATE 25 MG PO TABS
25.0000 mg | ORAL_TABLET | Freq: Two times a day (BID) | ORAL | Status: DC
  Administered 2018-06-17 – 2018-06-19 (×5): 25 mg via ORAL
  Filled 2018-06-17 (×5): qty 1

## 2018-06-17 MED ORDER — FUROSEMIDE 10 MG/ML IJ SOLN
20.0000 mg | Freq: Once | INTRAMUSCULAR | Status: AC
  Administered 2018-06-17: 20 mg via INTRAVENOUS
  Filled 2018-06-17: qty 2

## 2018-06-17 MED ORDER — IPRATROPIUM-ALBUTEROL 0.5-2.5 (3) MG/3ML IN SOLN
3.0000 mL | Freq: Three times a day (TID) | RESPIRATORY_TRACT | Status: DC
  Filled 2018-06-17: qty 3

## 2018-06-17 MED ORDER — MAGNESIUM SULFATE 2 GM/50ML IV SOLN
2.0000 g | Freq: Once | INTRAVENOUS | Status: AC
  Administered 2018-06-17: 2 g via INTRAVENOUS
  Filled 2018-06-17: qty 50

## 2018-06-17 MED ORDER — DEXTROSE 5 % IV SOLN: INTRAVENOUS | Status: DC

## 2018-06-17 MED ORDER — DEXTROSE 5 % IV SOLN
INTRAVENOUS | Status: DC
  Administered 2018-06-17 (×2): via INTRAVENOUS

## 2018-06-17 MED ORDER — LEVALBUTEROL HCL 0.63 MG/3ML IN NEBU
0.6300 mg | INHALATION_SOLUTION | Freq: Three times a day (TID) | RESPIRATORY_TRACT | Status: DC
  Administered 2018-06-17 – 2018-06-18 (×5): 0.63 mg via RESPIRATORY_TRACT
  Filled 2018-06-17 (×5): qty 3

## 2018-06-17 NOTE — Progress Notes (Signed)
@IPLOG @        PROGRESS NOTE                                                                                                                                                                                                             Patient Demographics:    Gerald [name], is a 72 y.o. male, DOB - Mar 01, 1946, ZJI:[id number]  Admit date - 06/16/2018   Admitting Physician Ivor Costa, MD  Outpatient Primary MD for the patient is Zimmer, Gwyndolyn Saxon, MD  LOS - 1  CC - Hyponatremia ( on labs)  Brief Narrative  Gerald [name] is a 72 y.o. male with medical history significant of hypertension, hyperlipidemia, diabetes mellitus, COPD on 2 L nasal cannula oxygen, GERD, OSA on CPAP, CAD, stent placement, kidney cancer (s/p of right nephrectomy per pt's report), bladder cancer (treated with BCG, followed by Dr. Lerry Liner of urology in [place]), who is transfered from [facility] due to a new lung mass with a CT chest which showed a 5 mm nodule and subcarinal lymphadenopathy, concerning for malignancy, hyponatremia, he was transferred to this hospital upon family request.  Evidently he received 3% normal saline along with Samsca when the sodium was around 120 and it rapidly rose to 130 within 10 to 12 hours requiring D5W when he arrived here.   Subjective:    Gerald [name] today has, No headache, No chest pain, No abdominal pain - No Nausea, No new weakness tingling or numbness, No Cough - mild SOB.     Assessment  & Plan :     1.  Hyponatremia.  Likely due to lung mass and SIADH, unfortunately he received 3% normal saline and Samsca at the outlying hospital causing some rapid correction which has now been stabilized after he received D5W.  Sodium has now corrected about 10 points in 24 hours.  Unfortunately he has started to develop railes along with shortness of breath and some tachycardia, at this point further administration of D5W will be held and he will receive a low-dose Lasix.  Monitor  electrolytes.  Discussed with nephrologist Dr. Johnney Ou.  2.  Lung mass.  Evaluation once hemodynamically stable.  Will call pulmonary.  Suspicious for malignancy due to his long-standing history of smoking.  3.  The OPD.  Currently no exacerbation.  Supportive care.  4.  OSA.  CPAP at night.  5.  HX of renal cancer with right-sided nephrectomy.  Stable.  Follows with Dr. Lerry Liner urologist in [place].  6.  CAD status post stent  placement.  No acute issues placed on aspirin and statin for secondary prevention.  7.  HTN with tachycardia.  Low-dose beta-blocker and monitor.  8.  Acute on chronic diastolic CHF.  EF 60% on echocardiogram done in [place] in 2015.  Repeat echocardiogram, hold further fluids, low-dose beta-blocker, gentle one-time Lasix and monitor.  9.  Minimal delirium.  Still awake and oriented x3, following all commands no focal deficits.  Wife educated.  PRN Haldol if needed.  Avoid benzodiazepines and narcotics.    Family Communication  :   wife  Code Status :  Full  Disposition Plan  :  TBD  Consults  :  Renal  Procedures  :    TTE  DVT Prophylaxis  :    Heparin    Lab Results  Component Value Date   PLT 189 06/17/2018    Diet :  Diet Order            Diet heart healthy/carb modified Room service appropriate? Yes; Fluid consistency: Thin  Diet effective now               Inpatient Medications Scheduled Meds: . aspirin EC  81 mg Oral Daily  . fluticasone  2 spray Each Nare Daily  . furosemide  20 mg Intravenous Once  . heparin  5,000 Units Subcutaneous Q8H  . insulin aspart  0-5 Units Subcutaneous QHS  . insulin aspart  0-9 Units Subcutaneous TID WC  . levalbuterol  0.63 mg Nebulization TID  . metoprolol tartrate  25 mg Oral BID  . pantoprazole  40 mg Oral Q1200  . phosphorus  500 mg Oral BID WC  . rosuvastatin  40 mg Oral q1800   Continuous Infusions: PRN Meds:.acetaminophen **OR** acetaminophen, albuterol,  dextromethorphan-guaiFENesin, hydrALAZINE, metoprolol tartrate, ondansetron **OR** ondansetron (ZOFRAN) IV, senna-docusate  Antibiotics  :   Anti-infectives (From admission, onward)   None          Objective:   Vitals:   06/16/18 2036 06/16/18 2334 06/17/18 0025 06/17/18 0425  BP: 124/84  113/77 (!) 136/93  Pulse: (!) 113 99 (!) 107 (!) 110  Resp: 19 18 20 17   Temp: 98.5 F (36.9 C)   98.3 F (36.8 C)  TempSrc:    Axillary  SpO2: 99% 94% (!) 89% 93%  Weight: 76.1 kg     Height: 5\' 9"  (1.753 m)       Wt Readings from Last 3 Encounters:  06/16/18 76.1 kg  06/06/18 80.1 kg  05/31/18 79.6 kg     Intake/Output Summary (Last 24 hours) at 06/17/2018 1401 Last data filed at 06/17/2018 0729 Gross per 24 hour  Intake 794.48 ml  Output 1701 ml  Net -906.52 ml     Physical Exam  Awake Alert, Oriented X 3, No new F.N deficits, Normal affect Hall.AT,PERRAL Supple Neck,No JVD, No cervical lymphadenopathy appriciated.  Symmetrical Chest wall movement, Good air movement bilaterally, ++ rales and coarse B.sounds RRR,No Gallops,Rubs or new Murmurs, No Parasternal Heave +ve B.Sounds, Abd Soft, No tenderness, No organomegaly appriciated, No rebound - guarding or rigidity. No Cyanosis, Clubbing or edema, No new Rash or bruise       Data Review:    CBC Recent Labs  Lab 06/16/18 2200 06/17/18 0445  WBC 12.7* 12.4*  HGB 13.9 14.5  HCT 39.8 41.3  PLT 178 189  MCV 90.2 91.4  MCH 31.5 32.1  MCHC 34.9 35.1  RDW 12.4 12.8    Chemistries  Recent Labs  Lab 06/16/18  2200 06/16/18 2326 06/17/18 0235 06/17/18 0445 06/17/18 0835 06/17/18 1008  NA 129*  --  132* 132* 130* 132*  K 3.8  --   --  4.7  --   --   CL 93*  --   --  95*  --   --   CO2 23  --   --  27  --   --   GLUCOSE 125*  --   --  191*  --   --   BUN 10  --   --  5*  --   --   CREATININE 0.92  --   --  1.07  --   --   CALCIUM 8.3*  --   --  8.4*  --   --   MG  --  1.5*  --   --   --  2.6*    ------------------------------------------------------------------------------------------------------------------ No results for input(s): CHOL, HDL, LDLCALC, TRIG, CHOLHDL, LDLDIRECT in the last 72 hours.  No results found for: HGBA1C ------------------------------------------------------------------------------------------------------------------ No results for input(s): TSH, T4TOTAL, T3FREE, THYROIDAB in the last 72 hours.  Invalid input(s): FREET3 ------------------------------------------------------------------------------------------------------------------ No results for input(s): VITAMINB12, FOLATE, FERRITIN, TIBC, IRON, RETICCTPCT in the last 72 hours.  Coagulation profile Recent Labs  Lab 06/17/18 0445  INR 1.08    No results for input(s): DDIMER in the last 72 hours.  Cardiac Enzymes Recent Labs  Lab 06/17/18 0835  TROPONINI <0.03   ------------------------------------------------------------------------------------------------------------------    Component Value Date/Time   BNP 40.5 06/16/2018 2200    Micro Results Recent Results (from the past 240 hour(s))  MRSA PCR Screening     Status: None   Collection Time: 06/17/18  1:51 AM  Result Value Ref Range Status   MRSA by PCR NEGATIVE NEGATIVE Final    Comment:        The GeneXpert MRSA Assay (FDA approved for NASAL specimens only), is one component of a comprehensive MRSA colonization surveillance program. It is not intended to diagnose MRSA infection nor to guide or monitor treatment for MRSA infections. Performed at [facility], [place] [address], [place], [place] [address]     Radiology Reports Dg Chest 2 View  Result Date: 05/31/2018 CLINICAL DATA:  Prior myocardial infarct. Chronic shortness of breath EXAM: CHEST - 2 VIEW COMPARISON:  December 22, 2017 FINDINGS: There is mild hyperexpansion with areas of scattered scarring bilaterally. There is no frank edema or consolidation. Heart size  and pulmonary vascularity are normal. There is aortic atherosclerosis. No adenopathy. There is evidence of an old healed fracture of the posterior left ninth rib. There is stable anterior wedging of several mid and lower thoracic vertebral bodies. There is left carotid artery calcification. IMPRESSION: No edema or consolidation. Areas of scattered scarring, stable. Lungs hyperexpanded. Stable cardiac silhouette. There is aortic atherosclerosis. Aortic Atherosclerosis (ICD10-I70.0). Electronically Signed   By: Lowella Grip III M.D.   On: 05/31/2018 11:01   Dg Chest Port 1 View  Result Date: 06/17/2018 CLINICAL DATA:  Shortness of Breath EXAM: PORTABLE CHEST 1 VIEW COMPARISON:  05/31/2018 FINDINGS: Left base atelectasis or developing infiltrate, new since prior study. Heart is normal size. Right lung is clear. No effusions or acute bony abnormality. IMPRESSION: New left basilar atelectasis or developing infiltrate. Electronically Signed   By: Rolm Baptise M.D.   On: 06/17/2018 11:31    Time Spent in minutes  30   Lala Lund M.D on 06/17/2018 at 2:01 PM  To page go to [email or web address] - password  [email or web address]

## 2018-06-17 NOTE — Progress Notes (Signed)
Patient recevied In  the unit via bed. Patient is alert and oriented x4. Patient is on bed side monitor. Iv in place. No any distress noticed and denies for pain. Patient given instruction about call bell and phone. Bed in low position and side rail up x2.call bell in reach.

## 2018-06-17 NOTE — Evaluation (Signed)
Physical Therapy Evaluation Patient Details Name: Gerald [name] MRN: [id number] DOB: [birth year] Today's Date: 06/17/2018   History of Present Illness  Pt is a 72 y/o male transferred from [facility] hospital secondary to hypnatremia. PMH includes CAD s/p stent placement, OSA on CPAP, bladder cancer, COPD on 2L, HTN, and kidney cancer s/p R nephrectomy.   Clinical Impression  Pt admitted secondary to problem above with deficits below. Pt very sleepy throughout, and presenting with difficulty sequencing during transfer to chair. Pt with weakness and unsteadiness and requiring min A to transfer to chair. Pt wheezing after transfer, however, oxygen sats WFL on 2L. No family present during session. Pt may progress well once cognition improves, and be able to progress to home with HHPT. However, feel pt will require increased assist at d/c based on current presentation, so recommending SNF at this time. Will continue to follow acutely to maximize functional mobility independence and safety.     Follow Up Recommendations SNF;Supervision/Assistance - 24 hour    Equipment Recommendations  None recommended by PT    Recommendations for Other Services OT consult     Precautions / Restrictions Precautions Precautions: Fall Restrictions Weight Bearing Restrictions: No      Mobility  Bed Mobility Overal bed mobility: Needs Assistance Bed Mobility: Supine to Sit     Supine to sit: Mod assist     General bed mobility comments: Mod A For sequencing, trunk elevation, and scooting hips to EOB. Initially upon sitting, pt laying back down abruptly, so had to cue to sit back up on EOB.   Transfers Overall transfer level: Needs assistance Equipment used: 1 person hand held assist Transfers: Sit to/from [facility] Sit to Stand: Mod assist;Min assist Stand pivot transfers: Min assist       General transfer comment: Attempted to stand with only HHA, however, pt unsteady and  could not get to full upright with mod A. Had pt sit back down and PT stood in front of pt and pt with easier transition to standing, however, continued to required min A. Min A for steadying assist for transfer to chair. Verbal/manual cues for sequencing. Pt wheezing upon sitting in chair, however, oxygen sats WFL on 2L.   Ambulation/Gait             General Gait Details: unable   Stairs            Wheelchair Mobility    Modified Rankin (Stroke Patients Only)       Balance Overall balance assessment: Needs assistance Sitting-balance support: No upper extremity supported;Feet supported Sitting balance-Leahy Scale: Fair     Standing balance support: Bilateral upper extremity supported;During functional activity Standing balance-Leahy Scale: Poor Standing balance comment: Reliant on UE and external support.                              Pertinent Vitals/Pain Pain Assessment: No/denies pain    Home Living Family/patient expects to be discharged to:: Private residence Living Arrangements: Spouse/significant other               Additional Comments: Unable to get home information, as pt falling asleep easily and not responding to questions.     Prior Function Level of Independence: Independent               Hand Dominance        Extremity/Trunk Assessment   Upper Extremity Assessment Upper Extremity Assessment: Defer to  OT evaluation    Lower Extremity Assessment Lower Extremity Assessment: Generalized weakness    Cervical / Trunk Assessment Cervical / Trunk Assessment: Kyphotic  Communication   Communication: No difficulties  Cognition Arousal/Alertness: Lethargic Behavior During Therapy: Flat affect Overall Cognitive Status: No family/caregiver present to determine baseline cognitive functioning                                 General Comments: Pt reports he did not get much sleep night before, so may be causing  current symptoms. Unsure of pt's baseline. Was alert and oriented X4, however, easily falling asleep throughout session. Did not respond to home environment questions. Pt sitting up and then laying back down spontaneously. Required multimodal cues throughout for sequencing during transfers.       General Comments General comments (skin integrity, edema, etc.): No family present in room     Exercises     Assessment/Plan    PT Assessment Patient needs continued PT services  PT Problem List Cardiopulmonary status limiting activity;Decreased knowledge of precautions;Decreased knowledge of use of DME;Decreased cognition;Decreased mobility;Decreased balance;Decreased activity tolerance;Decreased strength       PT Treatment Interventions Gait training;DME instruction;Functional mobility training;Therapeutic activities;Therapeutic exercise;Balance training;Patient/family education    PT Goals (Current goals can be found in the Care Plan section)  Acute Rehab PT Goals Patient Stated Goal: to get to chair  PT Goal Formulation: With patient Time For Goal Achievement: 07/01/18 Potential to Achieve Goals: Fair    Frequency Min 2X/week   Barriers to discharge        Co-evaluation               AM-PAC PT "6 Clicks" Daily Activity  Outcome Measure Difficulty turning over in bed (including adjusting bedclothes, sheets and blankets)?: A Lot Difficulty moving from lying on back to sitting on the side of the bed? : Unable Difficulty sitting down on and standing up from a chair with arms (e.g., wheelchair, bedside commode, etc,.)?: Unable Help needed moving to and from a bed to chair (including a wheelchair)?: A Little Help needed walking in hospital room?: A Lot Help needed climbing 3-5 steps with a railing? : A Lot 6 Click Score: 11    End of Session Equipment Utilized During Treatment: Gait belt;Oxygen Activity Tolerance: Patient limited by fatigue;Patient limited by  lethargy Patient left: in chair;with call bell/phone within reach Nurse Communication: Mobility status PT Visit Diagnosis: Unsteadiness on feet (R26.81);Muscle weakness (generalized) (M62.81)    Time: 2774-1287 PT Time Calculation (min) (ACUTE ONLY): 17 min   Charges:   PT Evaluation $PT Eval Moderate Complexity: Pratt, PT, DPT  Acute Rehabilitation Services  Pager: [phone] Office: [phone]   Rudean Hitt 06/17/2018, 6:16 PM

## 2018-06-17 NOTE — Progress Notes (Signed)
Echocardiogram 2D Echocardiogram has been performed.  Gerald [name] 06/17/2018, 3:45 PM

## 2018-06-17 NOTE — Telephone Encounter (Signed)
Nothing further needed for this encounter.

## 2018-06-17 NOTE — Progress Notes (Signed)
[facility] Progress Note   Subjective:   Patient admitted overnight and seen by Dr. Joelyn Oms for hyponatremia.  I have reviewed the notes and studies.   Patient has no complaints this morning.  Denies HA, confusion, nausea.  He has a condom catheter and UOP has been 1.7L since admitted.  He is currently on D5W 200/hr.   Objective Vitals:   06/16/18 2036 06/16/18 2334 06/17/18 0025 06/17/18 0425  BP: 124/84  113/77 (!) 136/93  Pulse: (!) 113 99 (!) 107 (!) 110  Resp: 19 18 20 17   Temp: 98.5 F (36.9 C)   98.3 F (36.8 C)  TempSrc:    Axillary  SpO2: 99% 94% (!) 89% 93%  Weight: 76.1 kg     Height: 5\' 9"  (1.753 m)      Physical Exam General: chronically ill appearing, comfortable, alert and interactive Heart:tachycardic, regular, 2+ radial pulse Lungs: prolonged exp phase, wheezing diffusely Abdomen: nontender Extremities: no edema  Additional Objective Labs: Basic Metabolic Panel: Recent Labs  Lab 06/16/18 2200 06/16/18 2326 06/17/18 0235 06/17/18 0445  NA 129*  --  132* 132*  K 3.8  --   --  4.7  CL 93*  --   --  95*  CO2 23  --   --  27  GLUCOSE 125*  --   --  191*  BUN 10  --   --  5*  CREATININE 0.92  --   --  1.07  CALCIUM 8.3*  --   --  8.4*  PHOS  --  1.8*  --   --    Liver Function Tests: No results for input(s): AST, ALT, ALKPHOS, BILITOT, PROT, ALBUMIN in the last 168 hours. No results for input(s): LIPASE, AMYLASE in the last 168 hours. CBC: Recent Labs  Lab 06/16/18 2200 06/17/18 0445  WBC 12.7* 12.4*  HGB 13.9 14.5  HCT 39.8 41.3  MCV 90.2 91.4  PLT 178 189   Blood Culture No results found for: SDES, SPECREQUEST, CULT, REPTSTATUS  Cardiac Enzymes: No results for input(s): CKTOTAL, CKMB, CKMBINDEX, TROPONINI in the last 168 hours. CBG: Recent Labs  Lab 06/16/18 2225 06/17/18 0813  GLUCAP 121* 180*   Iron Studies: No results for input(s): IRON, TIBC, TRANSFERRIN, FERRITIN in the last 72  hours. @lablastinr3 @ Studies/Results: No results found. Medications: . dextrose 200 mL/hr at 06/17/18 0746  . magnesium sulfate 1 - 4 g bolus IVPB 2 g (06/17/18 0836)   . amLODipine  5 mg Oral Daily  . aspirin EC  81 mg Oral Daily  . fluticasone  2 spray Each Nare Daily  . heparin  5,000 Units Subcutaneous Q8H  . insulin aspart  0-5 Units Subcutaneous QHS  . insulin aspart  0-9 Units Subcutaneous TID WC  . ipratropium-albuterol  3 mL Nebulization TID  . pantoprazole  40 mg Oral Q1200  . phosphorus  500 mg Oral BID WC  . rosuvastatin  40 mg Oral q1800  . traZODone  50 mg Oral QHS    Assessment/Plan: 1.   Hyponatremia:  Studies consistent with SIADH (serum osm 265, urine osm 640) .  Suspected pulmonary etiology.  Did have recent pulmonary nodule + adenopathy on CT chest, has significant COPD as well.  Sodium 06/02/2018 121.   His sodium by report was 120 yesterday AM with aid of hypertonic saline at OSH.  He received vaptan therapy at 4pm prior to transfer and on arrival here serum sodium 129 at 22:00 and further increased to  132 at 0235 and 132 at 0445.  Given overcorrection he has been started on D5W titrated from 150>200/hr.    The half life of vaptans range 3-12hrs depending on dose and liver function, so the effect should be coming off now and if the hypotonic fluid continues his serum sodium will likely swing back down.    His goal for correction over 24h should be 6-92mEq/L so we will aim for serum sodium of high 120s this AM. I spoke with his floor RN at about 8am and requested the q2h sodium checks continue for now-- his last was at 0445.  Given the gap I have asked that it be run stat.    Depending on course will make decision regarding additional therapies including fluid restriction, solute administration, loop diuretic use (dec urine concentration), vaptan use.   2.  COPD: care per primary  3.  DM:  Metformin at home, SSI here.   4.  HTN: amlodipine 5mg  daily, BP 130-150s.    Jannifer Hick MD 06/17/2018, 9:03 AM  [facility] Pager: [phone]

## 2018-06-17 NOTE — Consult Note (Signed)
Gerald [name] Admit Date: 06/16/2018 06/17/2018 Gerald [name] Requesting Physician:  Blaine Hamper  Reason for Consult:  Hyponatremia HPI:  72 year old male admitted from an outside hospital overnight where he presented with hyponatremia.  PMH Incudes:  COPD on chronic supplemental oxygen  Past tobacco user  Bladder cancer with history of BCG therapy  Hypertension on ARB  Hyperlipidemia on atorvastatin  Diabetes mellitus type 2  OSA on CPAP  CAD with history of PCI  Osteoporosis on bisphosphonate  Patient was seen by pulmonary medicine on 9/23.  His serum sodium level was 121.  Patient tells me he was then seen by primary care, notes not available, and monitored.  His serum sodium decreased and he was informed to present to the outside facility at 10/1 when he presented with a sodium of 114.  Apparently he was treated with hypertonic saline with his serum sodium increasing to around 120.  Reported other studies include a urine osmolality of 640, serum osmolality of 244, urine sodium of 176.  Today around 4 PM he received tolvaptan prior to transfer.  Work-up at the outside facility included a CT of the chest, results in the admission history and physical but worrisome for a 5 mm nodule in the right upper lobe with mediastinal lymphadenopathy.  Currently the patient feels well.  He says that he has felt well throughout the entirety of this issue.  He denies nausea, vomiting, altered cognition, confusion, headaches.  He describes some edema previously but has none present currently.  He feels breathing is at his baseline.  He denies use of chlorthalidone, hydrochlorothiazide, Maxide.  He has a normal GFR.  Labs upon arrival with serum sodium of 129 which appears to be of a correction of 9 and less than 24 hours.  Serum osmolality is depressed at 265.  Serum creatinine is 0.9.   Creatinine, Ser  Date Value  06/16/2018 0.92 mg/dL  06/02/2018 0.92 mg/dL  06/01/2008 1.21  05/31/2008 1.17   05/24/2008 0.90  ] I/Os:  ROS Balance of 12 systems is negative w/ exceptions as above  PMH  Past Medical History:  Diagnosis Date  . Bladder cancer (HCC)    BCG treatments  . CAD (coronary artery disease)   . COPD (chronic obstructive pulmonary disease) ([place])   . Heart attack ([place])   . Kidney disease    PSH  Past Surgical History:  Procedure Laterality Date  . CORONARY STENT PLACEMENT    . KIDNEY SURGERY  06-2009   right nephrectomy 2009 for bladder Ca   FH  Family History  Problem Relation Age of Onset  . Heart attack Mother 3       maternal side of family history   SH  reports that he quit smoking about 9 years ago. His smoking use included cigarettes. He has a 50.00 pack-year smoking history. He has never used smokeless tobacco. He reports that he does not drink alcohol or use drugs. Allergies No Known Allergies Home medications Prior to Admission medications   Medication Sig Start Date End Date Taking? Authorizing Provider  alendronate (FOSAMAX) 70 MG tablet Take 70 mg by mouth every 7 (seven) days.  01/02/11   [provider]  Celedonio Miyamoto 62.5-25 MCG/INH AEPB USE 1 INHALATION DAILY 05/02/18   Baird Lyons D, MD  aspirin 81 MG tablet Take 81 mg by mouth as needed.     [provider]  atorvastatin (LIPITOR) 80 MG tablet Take 80 mg by mouth daily.  [provider]  budesonide (PULMICORT) 0.5 MG/2ML nebulizer solution Take 2 mLs (0.5 mg total) by nebulization 2 (two) times daily. 08/31/14 06/06/18  Baird Lyons D, MD  Calcium Carbonate-Vitamin D (CALCIUM 600+D) 600-400 MG-UNIT per tablet Take 1 tablet by mouth daily.      [provider]  glipiZIDE (GLUCOTROL) 5 MG tablet Take 5 mg by mouth daily before breakfast.    [provider]  Ipratropium-Albuterol (COMBIVENT RESPIMAT) 20-100 MCG/ACT AERS respimat USE 1 INHALATION EVERY 6 HOURS AS NEEDED FOR WHEEZING OR SHORTNESS OF BREATH 03/02/18   Young, Tarri Fuller D, MD   losartan (COZAAR) 25 MG tablet Take 1 tablet by mouth daily. 11/04/14   [provider]  metFORMIN (GLUCOPHAGE) 500 MG tablet Take 1,000 mg by mouth 2 (two) times daily.  06/07/15   [provider]  omeprazole (PRILOSEC) 20 MG capsule Take 1 capsule by mouth daily. 04/26/15   [provider]  predniSONE (DELTASONE) 10 MG tablet Take 4 tabs for 2 days, then 3 tabs for 2 days, then 2 tabs for 2 days, then 1 tab for 2 days, then stop 05/31/18   Fenton Foy, NP  Respiratory Therapy Supplies (FLUTTER) DEVI Blow through 4 times per set, 3 sets per day 12/22/17   Baird Lyons D, MD  traZODone (DESYREL) 100 MG tablet  06/07/15   [provider]    Current Medications Scheduled Meds: . amLODipine  5 mg Oral Daily  . aspirin EC  81 mg Oral Daily  . fluticasone  2 spray Each Nare Daily  . heparin  5,000 Units Subcutaneous Q8H  . insulin aspart  0-5 Units Subcutaneous QHS  . insulin aspart  0-9 Units Subcutaneous TID WC  . ipratropium-albuterol  3 mL Nebulization TID  . pantoprazole  40 mg Oral Q1200  . rosuvastatin  40 mg Oral q1800  . traZODone  50 mg Oral QHS   Continuous Infusions: PRN Meds:.acetaminophen **OR** acetaminophen, albuterol, dextromethorphan-guaiFENesin, hydrALAZINE, ondansetron **OR** ondansetron (ZOFRAN) IV, senna-docusate, zolpidem  CBC Recent Labs  Lab 06/16/18 2200  WBC 12.7*  HGB 13.9  HCT 39.8  MCV 90.2  PLT 706   Basic Metabolic Panel Recent Labs  Lab 06/16/18 2200 06/16/18 2326  NA 129*  --   K 3.8  --   CL 93*  --   CO2 23  --   GLUCOSE 125*  --   BUN 10  --   CREATININE 0.92  --   CALCIUM 8.3*  --   PHOS  --  1.8*    Physical Exam  Blood pressure 124/84, pulse 99, temperature 98.5 F (36.9 C), resp. rate 18, height 5\' 9"  (1.753 m), weight 76.1 kg, SpO2 94 %. GEN: No acute distress, using CPAP machine ENT: NCAT EYES: EOMI CV: Mildly tachycardic, regular, normal S1 and S2 PULM: Scattered wheezing, diminished  breath sounds in bases, prolonged expiratory phase ABD: Soft, nontender SKIN: No rashes or lesions EXT: No peripheral edema NEURO: Alert and oriented x3, conversant, moves all extremities, nonfocal   Assessment 72 year old male with asymptomatic moderate hypotonic euvolemic hyponatremia most consistent with SIADH.  Most pressing concern is that he is correcting rapidly after receiving tolvaptan which is increasing risk for fluid shifts and neurological issues.  1. 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 PUlmonary nodule with LAN on recent CT Scan 3. COPD 4. OSA on CPAP 5. CAD 6. DM2 on metformin  Plan 1. Begin D5W at 150 mL's per hour, target is to achieve a  serum sodium in the mid 120s 2. Every 2 hours serum sodiums 3. Hold on any fluid restriction at the current time 4. Do not give any further vaptan 5. Follow-up urine osmolality (in context of tolvaptan), urine sodium 6. Ideally this will ultimately be managed with fluid restriction and likely will benefit from furosemide given urine osmolality greater than 600   Pearson Grippe MD [phone] pgr 06/17/2018, 1:42 AM

## 2018-06-17 NOTE — Progress Notes (Signed)
Pt placed on home cpap and tolerating well at this time.  RT will continue to monitor.

## 2018-06-18 LAB — SODIUM
Sodium: 131 mmol/L — ABNORMAL LOW (ref 135–145)
Sodium: 132 mmol/L — ABNORMAL LOW (ref 135–145)
Sodium: 133 mmol/L — ABNORMAL LOW (ref 135–145)

## 2018-06-18 LAB — CBC
HEMATOCRIT: 37.5 % — AB (ref 39.0–52.0)
Hemoglobin: 13.1 g/dL (ref 13.0–17.0)
MCH: 32.3 pg (ref 26.0–34.0)
MCHC: 34.9 g/dL (ref 30.0–36.0)
MCV: 92.4 fL (ref 78.0–100.0)
PLATELETS: 167 10*3/uL (ref 150–400)
RBC: 4.06 MIL/uL — ABNORMAL LOW (ref 4.22–5.81)
RDW: 13.4 % (ref 11.5–15.5)
WBC: 15.8 10*3/uL — ABNORMAL HIGH (ref 4.0–10.5)

## 2018-06-18 LAB — BASIC METABOLIC PANEL WITH GFR
Anion gap: 11 (ref 5–15)
BUN: 9 mg/dL (ref 8–23)
CO2: 21 mmol/L — ABNORMAL LOW (ref 22–32)
Calcium: 7.9 mg/dL — ABNORMAL LOW (ref 8.9–10.3)
Chloride: 98 mmol/L (ref 98–111)
Creatinine, Ser: 1.17 mg/dL (ref 0.61–1.24)
GFR calc Af Amer: >60 mL/min (ref >60)
GFR calc non Af Amer: >60 mL/min (ref >60)
Glucose, Bld: 166 mg/dL — ABNORMAL HIGH (ref 70–99)
Potassium: 4.3 mmol/L (ref 3.5–5.1)
Sodium: 130 mmol/L — ABNORMAL LOW (ref 135–145)

## 2018-06-18 LAB — PHOSPHORUS: Phosphorus: 2.7 mg/dL (ref 2.5–4.6)

## 2018-06-18 LAB — MAGNESIUM: Magnesium: 2.4 mg/dL (ref 1.7–2.4)

## 2018-06-18 LAB — GLUCOSE, CAPILLARY
GLUCOSE-CAPILLARY: 199 mg/dL — AB (ref 70–99)
GLUCOSE-CAPILLARY: 122 mg/dL — AB (ref 70–99)
Glucose-Capillary: 105 mg/dL — ABNORMAL HIGH (ref 70–99)
Glucose-Capillary: 201 mg/dL — ABNORMAL HIGH (ref 70–99)

## 2018-06-18 MED ORDER — FUROSEMIDE 10 MG/ML IJ SOLN
40.0000 mg | Freq: Once | INTRAMUSCULAR | Status: AC
  Administered 2018-06-18: 40 mg via INTRAVENOUS
  Filled 2018-06-18: qty 4

## 2018-06-18 NOTE — Progress Notes (Signed)
Occupational Therapy Evaluation Patient Details Name: Gerald [name] MRN: [id number] DOB: 10-05-1945 Today's Date: 06/18/2018    History of Present Illness Pt is a 72 y/o male transferred from [facility] hospital secondary to hypnatremia and due to a new lung mass with a CT chest which showed a 5 mm nodule and subcarinal lymphadenopathy, concerning formalignancy. PMH includes CAD s/p stent placement, OSA on CPAP, bladder cancer, COPD on 2L, HTN, and kidney cancer s/p R nephrectomy.    Clinical Impression   PTA, pt lived with his sife and was independent with ADL and mobility, worked in [employer] and drove. Pt clearing cognitively and was able to ambulate @ 110 ft with S with O2 @ 92 RA. Will follow acutely to facilitate safe DC home. . No OT follow up. Pt appreciative.     Follow Up Recommendations  No OT follow up;Supervision - Intermittent    Equipment Recommendations  None recommended by OT    Recommendations for Other Services       Precautions / Restrictions Precautions Precautions: Fall      Mobility Bed Mobility Overal bed mobility: Modified Independent                Transfers Overall transfer level: Needs assistance   Transfers: Sit to/from Stand;Stand Pivot Transfers Sit to Stand: Supervision Stand pivot transfers: Supervision            Balance Overall balance assessment: Mild deficits observed, not formally tested                                         ADL either performed or assessed with clinical judgement   ADL Overall ADL's : Needs assistance/impaired     Grooming: Set up;Standing   Upper Body Bathing: Set up;Sitting   Lower Body Bathing: Set up;Supervison/ safety;Sit to/from stand   Upper Body Dressing : Set up;Sitting   Lower Body Dressing: Set up;Sit to/from stand;Supervision/safety   Toilet Transfer: Supervision/safety;Ambulation   Toileting- Clothing Manipulation and Hygiene: Modified independent        Functional mobility during ADLs: Min guard       Vision Baseline Vision/History: Wears glasses Wears Glasses: At all times Patient Visual Report: No change from baseline       Perception     Praxis      Pertinent Vitals/Pain Pain Assessment: No/denies pain     Hand Dominance Right   Extremity/Trunk Assessment Upper Extremity Assessment Upper Extremity Assessment: Overall WFL for tasks assessed   Lower Extremity Assessment Lower Extremity Assessment: Defer to PT evaluation   Cervical / Trunk Assessment Cervical / Trunk Assessment: Normal   Communication Communication Communication: No difficulties   Cognition Arousal/Alertness: Awake/alert Behavior During Therapy: WFL for tasks assessed/performed Overall Cognitive Status: Within Functional Limits for tasks assessed                                 General Comments: cognition appears to be returning to baseline   General Comments       Exercises     Shoulder Instructions      Home Living Family/patient expects to be discharged to:: Private residence Living Arrangements: Spouse/significant other Available Help at Discharge: Available 24 hours/day Type of Home: House Home Access: Stairs to enter [employer] of Steps: 1   Home Layout: Two level;Able to  live on main level with bedroom/bathroom Alternate Level Stairs-Number of Steps: flight   Bathroom Shower/Tub: Occupational psychologist: Handicapped height Bathroom Accessibility: Yes How Accessible: Accessible via walker Home Equipment: [place] - 2 wheels;Cane - single point;Shower seat;Bedside commode;Wheelchair - manual          Prior Functioning/Environment Level of Independence: Independent        Comments: drives; does finances; medication        OT Problem List: Impaired balance (sitting and/or standing);Decreased activity tolerance      OT Treatment/Interventions: Self-care/ADL training;Therapeutic  exercise;Energy conservation;DME and/or AE instruction;Therapeutic activities;Cognitive remediation/compensation;Patient/family education;Balance training    OT Goals(Current goals can be found in the care plan section) Acute Rehab OT Goals Patient Stated Goal: to go home OT Goal Formulation: With patient Time For Goal Achievement: 07/02/18 Potential to Achieve Goals: Good  OT Frequency: Min 2X/week   Barriers to D/C:            Co-evaluation              AM-PAC PT "6 Clicks" Daily Activity     Outcome Measure Help from another person eating meals?: None Help from another person taking care of personal grooming?: None Help from another person toileting, which includes using toliet, bedpan, or urinal?: A Little Help from another person bathing (including washing, rinsing, drying)?: A Little Help from another person to put on and taking off regular upper body clothing?: None Help from another person to put on and taking off regular lower body clothing?: A Little 6 Click Score: 21   End of Session Equipment Utilized During Treatment: Gait belt Nurse Communication: Mobility status  Activity Tolerance: Patient tolerated treatment well Patient left: in chair;with call bell/phone within reach;with chair alarm set  OT Visit Diagnosis: Unsteadiness on feet (R26.81);Other symptoms and signs involving cognitive function                Time: 1342-1415 OT Time Calculation (min): 33 min Charges:  OT General Charges $OT Visit: 1 Visit OT Evaluation $OT Eval Moderate Complexity: 1 Mod OT Treatments $Self Care/Home Management : 8-22 mins  Maurie Boettcher, OT/L   Acute OT Clinical Specialist Acute Rehabilitation Services Pager [phone] Office [phone]   [facility] 06/18/2018, 3:48 PM

## 2018-06-18 NOTE — Progress Notes (Signed)
[facility] Progress Note   Subjective:   No new issues.  Feels well.   Lasix 40 IV given yesterday for developing volume overload.   Objective Vitals:   06/17/18 2100 06/17/18 2329 06/18/18 0808 06/18/18 0832  BP: 121/79   140/72  Pulse: 84 80  96  Resp: 19 20  20   Temp: (!) 97.5 F (36.4 C)     TempSrc: Axillary     SpO2: 95% 96% 91%   Weight:      Height:       Physical Exam General: chronically ill appearing, comfortable, alert and interactive Heart:RRR Lungs: improved WOB, a few basilar rales that clear with deep breath, scattered wheeze Abdomen: nontender Extremities: no edema  Additional Objective Labs: Basic Metabolic Panel: Recent Labs  Lab 06/16/18 2200 06/16/18 2326  06/17/18 0445  06/17/18 2254 06/18/18 0318 06/18/18 0628  NA 129*  --    < > 132*   < > 131* 130* 132*  K 3.8  --   --  4.7  --   --  4.3  --   CL 93*  --   --  95*  --   --  98  --   CO2 23  --   --  27  --   --  21*  --   GLUCOSE 125*  --   --  191*  --   --  166*  --   BUN 10  --   --  5*  --   --  9  --   CREATININE 0.92  --   --  1.07  --   --  1.17  --   CALCIUM 8.3*  --   --  8.4*  --   --  7.9*  --   PHOS  --  1.8*  --   --   --   --  2.7  --    < > = values in this interval not displayed.   Liver Function Tests: No results for input(s): AST, ALT, ALKPHOS, BILITOT, PROT, ALBUMIN in the last 168 hours. No results for input(s): LIPASE, AMYLASE in the last 168 hours. CBC: Recent Labs  Lab 06/16/18 2200 06/17/18 0445 06/18/18 0318  WBC 12.7* 12.4* 15.8*  HGB 13.9 14.5 13.1  HCT 39.8 41.3 37.5*  MCV 90.2 91.4 92.4  PLT 178 189 167   Blood Culture No results found for: SDES, SPECREQUEST, CULT, REPTSTATUS  Cardiac Enzymes: Recent Labs  Lab 06/17/18 0835 06/17/18 1338 06/17/18 1934  TROPONINI <0.03 <0.03 <0.03   CBG: Recent Labs  Lab 06/17/18 1150 06/17/18 1721 06/17/18 2238 06/18/18 0832 06/18/18 1134  GLUCAP 170* 235* 116* 105* 199*   Iron  Studies: No results for input(s): IRON, TIBC, TRANSFERRIN, FERRITIN in the last 72 hours. @lablastinr3 @ Studies/Results: Dg Chest Port 1 View  Result Date: 06/17/2018 CLINICAL DATA:  Shortness of Breath EXAM: PORTABLE CHEST 1 VIEW COMPARISON:  05/31/2018 FINDINGS: Left base atelectasis or developing infiltrate, new since prior study. Heart is normal size. Right lung is clear. No effusions or acute bony abnormality. IMPRESSION: New left basilar atelectasis or developing infiltrate. Electronically Signed   By: Rolm Baptise M.D.   On: 06/17/2018 11:31   Medications:  . aspirin EC  81 mg Oral Daily  . fluticasone  2 spray Each Nare Daily  . heparin  5,000 Units Subcutaneous Q8H  . insulin aspart  0-5 Units Subcutaneous QHS  . insulin aspart  0-9 Units Subcutaneous TID WC  .  levalbuterol  0.63 mg Nebulization TID  . metoprolol tartrate  25 mg Oral BID  . pantoprazole  40 mg Oral Q1200  . rosuvastatin  40 mg Oral q1800    Assessment/Plan: 1.   Hyponatremia:  Studies consistent with SIADH (serum osm 265, urine osm 640) .  Suspected pulmonary etiology.  Did have recent pulmonary nodule + adenopathy on CT chest, has significant COPD as well. TSH normal.  Sodium 06/02/2018 121, treated with vaptan and 3% NaCl at OSH prior to transfer. Initially overcorrected and required D5W but developed volume overload requiring diuretic therapy.    His sodium this morning is 132 and stable compared to yesterday evening.  Will recheck urine osm which will help guide therapy.  Check PM sodium today and again in AM given likely discharge tomorrow.    Maintain 1528mL fluid restriction for now.   2.  COPD: care per primary  3.  DM:  Metformin at home, SSI here.   4.  HTN: amlodipine 5mg  daily stopped yesterday, started on metoprolol 25 BID , BP 130-150s.   Jannifer Hick MD 06/18/2018, 12:51 PM  [facility] Pager: [phone]

## 2018-06-18 NOTE — Progress Notes (Signed)
@IPLOG @        PROGRESS NOTE                                                                                                                                                                                                             Patient Demographics:    Gerald [name], is a 72 y.o. male, DOB - [birth year], XLK:[id number]  Admit date - 06/16/2018   Admitting Physician Ivor Costa, MD  Outpatient Primary MD for the patient is Zimmer, Gwyndolyn Saxon, MD  LOS - 2  CC - Hyponatremia ( on labs)  Brief Narrative  Gerald [name] is a 72 y.o. male with medical history significant of hypertension, hyperlipidemia, diabetes mellitus, COPD on 2 L nasal cannula oxygen, GERD, OSA on CPAP, CAD, stent placement, kidney cancer (s/p of right nephrectomy per pt's report), bladder cancer (treated with BCG, followed by Dr. Lerry Liner of urology in [place]), who is transfered from [facility] due to a new lung mass with a CT chest which showed a 5 mm nodule and subcarinal lymphadenopathy, concerning for malignancy, hyponatremia, he was transferred to this hospital upon family request.  Evidently he received 3% normal saline along with Samsca when the sodium was around 120 and it rapidly rose to 130 within 10 to 12 hours requiring D5W when he arrived here.   Subjective:    Patient in bed, appears comfortable, denies any headache, no fever, no chest pain or pressure, no shortness of breath , no abdominal pain. No focal weakness.   Assessment  & Plan :     1.  Hyponatremia.  Likely due to lung mass and SIADH, stable now, gentle Lasix and fluid restriction continue to monitor.  2.  Lung mass.  Evaluation once hemodynamically stable.  Will call pulmonary.  Suspicious for malignancy due to his long-standing history of smoking.  3.  History of underlying COPD.  Currently no exacerbation.  Supportive care.  4.  OSA.  CPAP at night.  5.  HX of renal cancer with right-sided nephrectomy.  Stable.  Follows with Dr.  Lerry Liner urologist in [place].  6.  CAD status post stent placement.  No acute issues placed on aspirin and statin for secondary prevention.  7.  HTN with tachycardia.  Low-dose beta-blocker and monitor.  8.  Acute on chronic diastolic CHF.  EF 60% on echocardiogram done in [place] in 2015.  Repeat echocardiogram, hold further fluids, low-dose beta-blocker, fluid restriction and gentle Lasix as needed.  9.  Minimal delirium.  Improved morning of 06/18/2018, wife  was educated on 06/17/2018, PRN Haldol if needed.  Avoid benzodiazepines and narcotics.    Family Communication  :   wife  Code Status :  Full  Disposition Plan  : PT, if better likely discharge tomorrow.  Consults  :  Renal  Procedures  :     TTE - Very technically difficult study with subcostal images only.  Grossly normal LVEF of 60-65%, IVC suggests elevated RA pressure. Consider alternate imaging modality if indicated.  DVT Prophylaxis  :    Heparin    Lab Results  Component Value Date   PLT 167 06/18/2018    Diet :  Diet Order            Diet heart healthy/carb modified Room service appropriate? Yes; Fluid consistency: Thin; Fluid restriction: 1500 mL Fluid  Diet effective now               Inpatient Medications Scheduled Meds: . aspirin EC  81 mg Oral Daily  . fluticasone  2 spray Each Nare Daily  . heparin  5,000 Units Subcutaneous Q8H  . insulin aspart  0-5 Units Subcutaneous QHS  . insulin aspart  0-9 Units Subcutaneous TID WC  . levalbuterol  0.63 mg Nebulization TID  . metoprolol tartrate  25 mg Oral BID  . pantoprazole  40 mg Oral Q1200  . rosuvastatin  40 mg Oral q1800   Continuous Infusions: PRN Meds:.acetaminophen **OR** [DISCONTINUED] acetaminophen, albuterol, dextromethorphan-guaiFENesin, hydrALAZINE, metoprolol tartrate, [DISCONTINUED] ondansetron **OR** ondansetron (ZOFRAN) IV, senna-docusate  Antibiotics  :   Anti-infectives (From admission, onward)   None           Objective:   Vitals:   06/17/18 2329 06/18/18 0808 06/18/18 0832 06/18/18 1428  BP:   140/72   Pulse: 80  96   Resp: 20  20   Temp:      TempSrc:      SpO2: 96% 91%  93%  Weight:      Height:        Wt Readings from Last 3 Encounters:  06/16/18 76.1 kg  06/06/18 80.1 kg  05/31/18 79.6 kg     Intake/Output Summary (Last 24 hours) at 06/18/2018 1459 Last data filed at 06/18/2018 0834 Gross per 24 hour  Intake 80 ml  Output -  Net 80 ml     Physical Exam  Awake Alert, Oriented X 3, No new F.N deficits, Normal affect [place].AT,PERRAL Supple Neck,No JVD, No cervical lymphadenopathy appriciated.  Symmetrical Chest wall movement, Good air movement bilaterally, few rales RRR,No Gallops,Rubs or new Murmurs, No Parasternal Heave +ve B.Sounds, Abd Soft, No tenderness, No organomegaly appriciated, No rebound - guarding or rigidity. No Cyanosis, Clubbing or edema, No new Rash or bruise       Data Review:    CBC Recent Labs  Lab 06/16/18 2200 06/17/18 0445 06/18/18 0318  WBC 12.7* 12.4* 15.8*  HGB 13.9 14.5 13.1  HCT 39.8 41.3 37.5*  PLT 178 189 167  MCV 90.2 91.4 92.4  MCH 31.5 32.1 32.3  MCHC 34.9 35.1 34.9  RDW 12.4 12.8 13.4    Chemistries  Recent Labs  Lab 06/16/18 2200 06/16/18 2326  06/17/18 0445  06/17/18 1008  06/17/18 1934 06/17/18 2254 06/18/18 0318 06/18/18 0628 06/18/18 1329  NA 129*  --    < > 132*   < > 132*   < > 132* 131* 130* 132* 133*  K 3.8  --   --  4.7  --   --   --   --   --  4.3  --   --   CL 93*  --   --  95*  --   --   --   --   --  98  --   --   CO2 23  --   --  27  --   --   --   --   --  21*  --   --   GLUCOSE 125*  --   --  191*  --   --   --   --   --  166*  --   --   BUN 10  --   --  5*  --   --   --   --   --  9  --   --   CREATININE 0.92  --   --  1.07  --   --   --   --   --  1.17  --   --   CALCIUM 8.3*  --   --  8.4*  --   --   --   --   --  7.9*  --   --   MG  --  1.5*  --   --   --  2.6*  --   --   --  2.4  --   --     < > = values in this interval not displayed.   ------------------------------------------------------------------------------------------------------------------ No results for input(s): CHOL, HDL, LDLCALC, TRIG, CHOLHDL, LDLDIRECT in the last 72 hours.  No results found for: HGBA1C ------------------------------------------------------------------------------------------------------------------ Recent Labs    06/17/18 1451  TSH 0.510   ------------------------------------------------------------------------------------------------------------------ No results for input(s): VITAMINB12, FOLATE, FERRITIN, TIBC, IRON, RETICCTPCT in the last 72 hours.  Coagulation profile Recent Labs  Lab 06/17/18 0445  INR 1.08    No results for input(s): DDIMER in the last 72 hours.  Cardiac Enzymes Recent Labs  Lab 06/17/18 0835 06/17/18 1338 06/17/18 1934  TROPONINI <0.03 <0.03 <0.03   ------------------------------------------------------------------------------------------------------------------    Component Value Date/Time   BNP 60.6 06/17/2018 1338    Micro Results Recent Results (from the past 240 hour(s))  MRSA PCR Screening     Status: None   Collection Time: 06/17/18  1:51 AM  Result Value Ref Range Status   MRSA by PCR NEGATIVE NEGATIVE Final    Comment:        The GeneXpert MRSA Assay (FDA approved for NASAL specimens only), is one component of a comprehensive MRSA colonization surveillance program. It is not intended to diagnose MRSA infection nor to guide or monitor treatment for MRSA infections. Performed at [facility], [place] [address] [place], [place] [address]     Radiology Reports Dg Chest 2 View  Result Date: 05/31/2018 CLINICAL DATA:  Prior myocardial infarct. Chronic shortness of breath EXAM: CHEST - 2 VIEW COMPARISON:  December 22, 2017 FINDINGS: There is mild hyperexpansion with areas of scattered scarring bilaterally. There is no frank edema or  consolidation. Heart size and pulmonary vascularity are normal. There is aortic atherosclerosis. No adenopathy. There is evidence of an old healed fracture of the posterior left ninth rib. There is stable anterior wedging of several mid and lower thoracic vertebral bodies. There is left carotid artery calcification. IMPRESSION: No edema or consolidation. Areas of scattered scarring, stable. Lungs hyperexpanded. Stable cardiac silhouette. There is aortic atherosclerosis. Aortic Atherosclerosis (ICD10-I70.0). Electronically Signed   By: Lowella Grip III M.D.   On: 05/31/2018 11:01   Dg Chest [facility]  1 View  Result Date: 06/17/2018 CLINICAL DATA:  Shortness of Breath EXAM: PORTABLE CHEST 1 VIEW COMPARISON:  05/31/2018 FINDINGS: Left base atelectasis or developing infiltrate, new since prior study. Heart is normal size. Right lung is clear. No effusions or acute bony abnormality. IMPRESSION: New left basilar atelectasis or developing infiltrate. Electronically Signed   By: Rolm Baptise M.D.   On: 06/17/2018 11:31    Time Spent in minutes  30   Lala Lund M.D on 06/18/2018 at 2:59 PM  To page go to [email or web address] - password [facility]

## 2018-06-19 LAB — BASIC METABOLIC PANEL
ANION GAP: 13 (ref 5–15)
BUN: 16 mg/dL (ref 8–23)
CALCIUM: 8.6 mg/dL — AB (ref 8.9–10.3)
CO2: 24 mmol/L (ref 22–32)
Chloride: 94 mmol/L — ABNORMAL LOW (ref 98–111)
Creatinine, Ser: 1.27 mg/dL — ABNORMAL HIGH (ref 0.61–1.24)
GFR, EST NON AFRICAN AMERICAN: 54 mL/min — AB (ref >60)
Glucose, Bld: 141 mg/dL — ABNORMAL HIGH (ref 70–99)
Potassium: 3.8 mmol/L (ref 3.5–5.1)
SODIUM: 131 mmol/L — AB (ref 135–145)

## 2018-06-19 LAB — OSMOLALITY, URINE: Osmolality, Ur: 608 mOsm/kg (ref 300–900)

## 2018-06-19 LAB — GLUCOSE, CAPILLARY
GLUCOSE-CAPILLARY: 127 mg/dL — AB (ref 70–99)
GLUCOSE-CAPILLARY: 123 mg/dL — AB (ref 70–99)

## 2018-06-19 LAB — MAGNESIUM: MAGNESIUM: 2.2 mg/dL (ref 1.7–2.4)

## 2018-06-19 MED ORDER — LEVALBUTEROL HCL 0.63 MG/3ML IN NEBU: 0.6300 mg | INHALATION_SOLUTION | Freq: Three times a day (TID) | RESPIRATORY_TRACT | Status: DC | PRN

## 2018-06-19 MED ORDER — METOPROLOL TARTRATE 25 MG PO TABS: 25.0000 mg | ORAL_TABLET | Freq: Two times a day (BID) | ORAL | 0 refills | Status: DC

## 2018-06-19 MED ORDER — METOPROLOL TARTRATE 25 MG PO TABS: 25.0000 mg | ORAL_TABLET | Freq: Two times a day (BID) | ORAL | 0 refills | Status: AC

## 2018-06-19 NOTE — Discharge Summary (Signed)
Gerald [name] WOE:[id number] DOB: September 23, 1945 DOA: 06/16/2018  PCP: Olena Mater, MD  Admit date: 06/16/2018  Discharge date: 06/19/2018  Admitted From: Home   Disposition:  Home   Recommendations for Outpatient Follow-up:   Follow up with PCP in 1-2 weeks  PCP Please obtain BMP/CBC, 2 view CXR in 1week,  (see Discharge instructions)   PCP Please follow up on the following pending results: Kindly monitor BMP closely, needs outpatient pulmonary follow-up for a lung mass that was noted on a recent hospitalization in [place].  Obtain records from there.   Home Health: PT, RN   Equipment/Devices: None  Consultations: Renal Discharge Condition: Stable   CODE STATUS: Full   Diet Recommendation: Heart Healthy Low Carb, strict 1.2 L/day total fluid restriction.   CC - transferred from [facility] for severe hyponatremia  Brief history of present illness from the day of admission and additional interim summary    Gerald Strojny Lackeyis a 72 y.o.malewith medical history significant ofhypertension, hyperlipidemia, diabetes mellitus, COPD on 2 L nasal cannula oxygen, GERD, OSA on CPAP, CAD, stent placement, kidney cancer (s/p of right nephrectomy per pt's report), bladder cancer (treated with BCG, followed by Dr. Lerry Liner of urology in [facility] due to a new lung mass with a CT chest which showed a 5 mm nodule and subcarinal lymphadenopathy, concerning formalignancy, hyponatremia, he was transferred to this hospital upon family request.  Evidently he received 3% normal saline along with Samsca when the sodium was around 120 and it rapidly rose to 130 within 10 to 12 hours requiring D5W when he arrived here.                                                                 Hospital Course     1.  Hyponatremia.  Likely due to lung mass and SIADH,  hold after fluid restriction and Lasix, sodium 131 from 120 now, will be discharged home on fluid restriction which is strict 1.2 L/day, patient counseled personally, discussed with nephrology good to be discharged home with close outpatient PCP follow-up in the next 2 to 3 days..  2.  Lung mass.  Evaluation once hemodynamically stable.  Discussed with pulmonary here they recommend outpatient work-up, Suspicious for malignancy due to his long-standing history of smoking.  3.  History of underlying COPD.  Currently no exacerbation.  Supportive care.  Continue home nebulizer treatments per schedule upon discharge.  4.  OSA.  CPAP at night.  5.  HX of renal cancer with right-sided nephrectomy.  Stable.  Follows with Dr. Lerry Liner urologist in [place].  6.  CAD status post stent placement.  No acute issues placed on aspirin and statin for secondary prevention.  7.  HTN with tachycardia.  Dose beta-blocker..  8.  Acute on chronic diastolic CHF.  EF 60% on echocardiogram - resolved after gentle diuresis.  9.  Minimal delirium.  Improved morning of 06/18/2018, wife was educated on 06/17/2018, PRN Haldol if needed.  Avoid benzodiazepines and narcotics.    Discharge diagnosis     Principal Problem:   Hyponatremia Active Problems:   CAD (coronary artery disease)   Obstructive sleep apnea   Chronic respiratory failure with hypoxia (HCC)   COPD (chronic obstructive pulmonary disease) (HCC)   SIADH (syndrome of inappropriate ADH production) (HCC)   Diabetes mellitus without complication (HCC)   HLD (hyperlipidemia)   GERD (gastroesophageal reflux disease)   Lung nodule   Hypophosphatemia   Hypomagnesemia    Discharge instructions    Discharge Instructions    Diet - low sodium heart healthy   Complete by:  As directed    Discharge instructions   Complete by:  As directed    Follow with Primary MD Olena Mater, MD  in 5 days   Get CBC, CMP, 2 view Chest X ray checked  by Primary MD in 5 days    Activity: As tolerated with Full fall precautions use walker/cane & assistance as needed  Disposition Home    Diet: Heart Healthy - Low Carb, strict 1.2 lit/day fluid restriction.  Special Instructions: If you have smoked or chewed Tobacco  in the last 2 yrs please stop smoking, stop any regular Alcohol  and or any Recreational drug use.  On your next visit with your primary care physician please Get Medicines reviewed and adjusted.  Please request your Prim.MD to go over all Hospital Tests and Procedure/Radiological results at the follow up, please get all Hospital records sent to your Prim MD by signing hospital release before you go home.  If you experience worsening of your admission symptoms, develop shortness of breath, life threatening emergency, suicidal or homicidal thoughts you must seek medical attention immediately by calling 911 or calling your MD immediately  if symptoms less severe.  You Must read complete instructions/literature along with all the possible adverse reactions/side effects for all the Medicines you take and that have been prescribed to you. Take any new Medicines after you have completely understood and accpet all the possible adverse reactions/side effects.   Increase activity slowly   Complete by:  As directed       Discharge Medications   Allergies as of 06/19/2018   No Known Allergies     Medication List    TAKE these medications   alendronate 70 MG tablet Commonly known as:  FOSAMAX Take 70 mg by mouth every 7 (seven) days.   ANORO ELLIPTA 62.5-25 MCG/INH Aepb Generic drug:  umeclidinium-vilanterol USE 1 INHALATION DAILY What changed:  See the new instructions.   aspirin 81 MG tablet Take 81 mg by mouth daily.   atorvastatin 80 MG tablet Commonly known as:  LIPITOR Take 80 mg by mouth daily.   budesonide 0.5 MG/2ML nebulizer solution Commonly known as:   PULMICORT Take 2 mLs (0.5 mg total) by nebulization 2 (two) times daily.   CALCIUM 600+D 600-400 MG-UNIT tablet Generic drug:  Calcium Carbonate-Vitamin D Take 1 tablet by mouth daily.   FLUTTER Devi Blow through 4 times per set, 3 sets per day   glipiZIDE 5 MG tablet Commonly known as:  GLUCOTROL Take 5 mg by mouth daily before breakfast.   Ipratropium-Albuterol 20-100 MCG/ACT Aers respimat Commonly known as:  COMBIVENT USE 1 INHALATION EVERY 6 HOURS AS NEEDED FOR WHEEZING OR SHORTNESS OF BREATH   losartan  25 MG tablet Commonly known as:  COZAAR Take 1 tablet by mouth daily.   metFORMIN 500 MG tablet Commonly known as:  GLUCOPHAGE Take 1,000 mg by mouth 2 (two) times daily.   metoprolol tartrate 25 MG tablet Commonly known as:  LOPRESSOR Take 1 tablet (25 mg total) by mouth 2 (two) times daily.   omeprazole 20 MG capsule Commonly known as:  PRILOSEC Take 20 mg by mouth daily.   traZODone 100 MG tablet Commonly known as:  DESYREL Take 50 mg by mouth at bedtime.       Follow-up Information    Zimmer, William, MD. Schedule an appointment as soon as possible for a visit in 5 day(s).   Specialty:  Internal Medicine Contact information: [address] [place] [address] [phone]        Juanito Doom, MD. Schedule an appointment as soon as possible for a visit in 1 week(s).   Specialty:  Pulmonary Disease Why:  ? lung mass Contact information: [place] [address] [phone]           Major procedures and Radiology Reports - PLEASE review detailed and final reports thoroughly  -      TTE - Very technically difficult study with subcostal images only. Grossly normal LVEF of 60-65%, IVC suggests elevated RA pressure. Consider alternate imaging modality if indicated.   Dg Chest 2 View  Result Date: 05/31/2018 CLINICAL DATA:  Prior myocardial infarct. Chronic shortness of breath EXAM: CHEST - 2 VIEW COMPARISON:  December 22, 2017 FINDINGS: There is mild hyperexpansion with areas of scattered scarring bilaterally. There is no frank edema or consolidation. Heart size and pulmonary vascularity are normal. There is aortic atherosclerosis. No adenopathy. There is evidence of an old healed fracture of the posterior left ninth rib. There is stable anterior wedging of several mid and lower thoracic vertebral bodies. There is left carotid artery calcification. IMPRESSION: No edema or consolidation. Areas of scattered scarring, stable. Lungs hyperexpanded. Stable cardiac silhouette. There is aortic atherosclerosis. Aortic Atherosclerosis (ICD10-I70.0). Electronically Signed   By: Lowella Grip III M.D.   On: 05/31/2018 11:01   Dg Chest Port 1 View  Result Date: 06/17/2018 CLINICAL DATA:  Shortness of Breath EXAM: PORTABLE CHEST 1 VIEW COMPARISON:  05/31/2018 FINDINGS: Left base atelectasis or developing infiltrate, new since prior study. Heart is normal size. Right lung is clear. No effusions or acute bony abnormality. IMPRESSION: New left basilar atelectasis or developing infiltrate. Electronically Signed   By: Rolm Baptise M.D.   On: 06/17/2018 11:31    Micro Results     Recent Results (from the past 240 hour(s))  MRSA PCR Screening     Status: None   Collection Time: 06/17/18  1:51 AM  Result Value Ref Range Status   MRSA by PCR NEGATIVE NEGATIVE Final    Comment:        The GeneXpert MRSA Assay (FDA approved for NASAL specimens only), is one component of a comprehensive MRSA colonization surveillance program. It is not intended to diagnose MRSA infection nor to guide or monitor treatment for MRSA infections. Performed at [facility], [place] [address], [place], [place] [address]     Today   Subjective    Kaelob Persky today has no headache,no chest abdominal pain,no new weakness tingling or numbness, feels much better wants to go home today.     Objective   Blood pressure 129/79, pulse 87,  temperature 98.2 F (36.8 C), temperature  source Oral, resp. rate 18, height 5\' 9"  (1.753 m), weight 76.1 kg, SpO2 94 %.   Intake/Output Summary (Last 24 hours) at 06/19/2018 1036 Last data filed at 06/19/2018 0550 Gross per 24 hour  Intake 222 ml  Output 150 ml  Net 72 ml    Exam  Awake Alert, Oriented x 3, No new F.N deficits, Normal affect [place].AT,PERRAL Supple Neck,No JVD, No cervical lymphadenopathy appriciated.  Symmetrical Chest wall movement, Good air movement bilaterally, CTAB RRR,No Gallops,Rubs or new Murmurs, No Parasternal Heave +ve B.Sounds, Abd Soft, Non tender, No organomegaly appriciated, No rebound -guarding or rigidity. No Cyanosis, Clubbing or edema, No new Rash or bruise   Data Review   CBC w Diff:  Lab Results  Component Value Date   WBC 15.8 (H) 06/18/2018   HGB 13.1 06/18/2018   HCT 37.5 (L) 06/18/2018   PLT 167 06/18/2018   LYMPHOPCT 7.0 (L) 06/02/2018   MONOPCT 4.5 06/02/2018   EOSPCT 0.1 06/02/2018   BASOPCT 0.3 06/02/2018    CMP:  Lab Results  Component Value Date   NA 131 (L) 06/19/2018   K 3.8 06/19/2018   CL 94 (L) 06/19/2018   CO2 24 06/19/2018   BUN 16 06/19/2018   CREATININE 1.27 (H) 06/19/2018  .   Total Time in preparing paper work, data evaluation and todays exam - 11 minutes  Lala Lund M.D on 06/19/2018 at 10:36 AM  [facility]   Office  [phone]

## 2018-06-19 NOTE — Care Management Note (Signed)
Case Management Note  Patient Details  Name: Gerald [name] MRN: [id number] Date of Birth: 1946-01-26  Subjective/Objective:                    Action/Plan:  Patient refused [place] services. No other CM needs identified.   Expected Discharge Date:  06/19/18               Expected Discharge Plan:     In-House Referral:     Discharge planning Services  CM Consult  Post Acute Care Choice:    Choice offered to:     DME Arranged:    DME Agency:     HH Arranged:  Patient Refused [employer] Agency:     Status of Service:  Completed, signed off  If discussed at [employer] of Stay Meetings, dates discussed:    Additional Comments:  Carles Collet, RN 06/19/2018, 11:05 AM

## 2018-06-19 NOTE — Progress Notes (Signed)
Notified by bedside RN that patient has now changed mind and would like HH. Referral accepted by [facility], updated AVS.

## 2018-06-19 NOTE — Discharge Instructions (Signed)
Follow with Primary MD Zimmer, Gwyndolyn Saxon, MD in 5 days   Get CBC, CMP, 2 view Chest X ray checked  by Primary MD in 5 days    Activity: As tolerated with Full fall precautions use walker/cane & assistance as needed  Disposition Home    Diet: Heart Healthy - Low Carb, strict 1.2 lit/day fluid restriction.  Special Instructions: If you have smoked or chewed Tobacco  in the last 2 yrs please stop smoking, stop any regular Alcohol  and or any Recreational drug use.  On your next visit with your primary care physician please Get Medicines reviewed and adjusted.  Please request your Prim.MD to go over all Hospital Tests and Procedure/Radiological results at the follow up, please get all Hospital records sent to your Prim MD by signing hospital release before you go home.  If you experience worsening of your admission symptoms, develop shortness of breath, life threatening emergency, suicidal or homicidal thoughts you must seek medical attention immediately by calling 911 or calling your MD immediately  if symptoms less severe.  You Must read complete instructions/literature along with all the possible adverse reactions/side effects for all the Medicines you take and that have been prescribed to you. Take any new Medicines after you have completely understood and accpet all the possible adverse reactions/side effects.

## 2018-06-19 NOTE — Progress Notes (Signed)
Nsg Discharge Note  Admit Date:  06/16/2018 Discharge date: 06/19/2018   Gerald [name] to be D/C'd Home per MD order.  AVS completed.  Copy for chart, and copy for patient signed, and dated. Patient/caregiver able to verbalize understanding.  Discharge Medication: Allergies as of 06/19/2018   No Known Allergies     Medication List    TAKE these medications   alendronate 70 MG tablet Commonly known as:  FOSAMAX Take 70 mg by mouth every 7 (seven) days.   ANORO ELLIPTA 62.5-25 MCG/INH Aepb Generic drug:  umeclidinium-vilanterol USE 1 INHALATION DAILY What changed:  See the new instructions.   aspirin 81 MG tablet Take 81 mg by mouth daily.   atorvastatin 80 MG tablet Commonly known as:  LIPITOR Take 80 mg by mouth daily.   budesonide 0.5 MG/2ML nebulizer solution Commonly known as:  PULMICORT Take 2 mLs (0.5 mg total) by nebulization 2 (two) times daily.   CALCIUM 600+D 600-400 MG-UNIT tablet Generic drug:  Calcium Carbonate-Vitamin D Take 1 tablet by mouth daily.   FLUTTER Devi Blow through 4 times per set, 3 sets per day   glipiZIDE 5 MG tablet Commonly known as:  GLUCOTROL Take 5 mg by mouth daily before breakfast.   Ipratropium-Albuterol 20-100 MCG/ACT Aers respimat Commonly known as:  COMBIVENT USE 1 INHALATION EVERY 6 HOURS AS NEEDED FOR WHEEZING OR SHORTNESS OF BREATH   losartan 25 MG tablet Commonly known as:  COZAAR Take 1 tablet by mouth daily.   metFORMIN 500 MG tablet Commonly known as:  GLUCOPHAGE Take 1,000 mg by mouth 2 (two) times daily.   metoprolol tartrate 25 MG tablet Commonly known as:  LOPRESSOR Take 1 tablet (25 mg total) by mouth 2 (two) times daily.   omeprazole 20 MG capsule Commonly known as:  PRILOSEC Take 20 mg by mouth daily.   traZODone 100 MG tablet Commonly known as:  DESYREL Take 50 mg by mouth at bedtime.       Discharge Assessment: Vitals:   06/19/18 0545 06/19/18 0854  BP: 140/73 129/79  Pulse: (!) 105 87   Resp: 18   Temp: 98.2 F (36.8 C)   SpO2: 94%    Skin clean, dry and intact without evidence of skin break down, no evidence of skin tears noted. IV catheter discontinued intact. Site without signs and symptoms of complications - no redness or edema noted at insertion site, patient denies c/o pain - only slight tenderness at site.  Dressing with slight pressure applied.  D/c Instructions-Education: Discharge instructions given to patient/family with verbalized understanding. D/c education completed with patient/family including follow up instructions, medication list, d/c activities limitations if indicated, with other d/c instructions as indicated by MD - patient able to verbalize understanding, all questions fully answered.  Patient initially refused home health PT/RN but after wife's arrival, patient would like to be set up with home health. Gerald [name] paged and [facility] ordered for pt.  Patient instructed to return to ED, call 911, or call MD for any changes in condition.  Patient escorted via [place], and D/C home via private auto.  Hiram Comber, RN 06/19/2018 11:09 AM

## 2018-06-20 ENCOUNTER — Telehealth: Payer: Self-pay | Admitting: Internal Medicine

## 2018-06-20 NOTE — Telephone Encounter (Signed)
Called and spoke with Patient.  He is a CY patient and was ok with scheduling appointment with BQ, but he wanted to be seen in 1 week, because of lung mass, and BQ had nothing open.  Patient requested to see CY, because he had a appointment opening at 1100 this week 06/22/18.  Patient scheduled at 1100 06/22/18 with CY.  Nothing further at this time.

## 2018-06-22 ENCOUNTER — Encounter: Payer: Self-pay | Admitting: Internal Medicine

## 2018-06-22 ENCOUNTER — Other Ambulatory Visit (INDEPENDENT_AMBULATORY_CARE_PROVIDER_SITE_OTHER): Payer: Medicare Other

## 2018-06-22 ENCOUNTER — Ambulatory Visit (INDEPENDENT_AMBULATORY_CARE_PROVIDER_SITE_OTHER): Payer: Medicare Other | Admitting: Internal Medicine

## 2018-06-22 ENCOUNTER — Telehealth: Payer: Self-pay | Admitting: Internal Medicine

## 2018-06-22 VITALS — BP 122/80 | HR 81 | Ht 68.0 in | Wt 161.6 lb

## 2018-06-22 DIAGNOSIS — J9611 Chronic respiratory failure with hypoxia: Secondary | ICD-10-CM

## 2018-06-22 DIAGNOSIS — E222 Syndrome of inappropriate secretion of antidiuretic hormone: Secondary | ICD-10-CM

## 2018-06-22 DIAGNOSIS — J449 Chronic obstructive pulmonary disease, unspecified: Secondary | ICD-10-CM | POA: Diagnosis not present

## 2018-06-22 DIAGNOSIS — R911 Solitary pulmonary nodule: Secondary | ICD-10-CM

## 2018-06-22 DIAGNOSIS — Z23 Encounter for immunization: Secondary | ICD-10-CM | POA: Diagnosis not present

## 2018-06-22 DIAGNOSIS — J441 Chronic obstructive pulmonary disease with (acute) exacerbation: Secondary | ICD-10-CM

## 2018-06-22 DIAGNOSIS — E871 Hypo-osmolality and hyponatremia: Secondary | ICD-10-CM

## 2018-06-22 DIAGNOSIS — G4733 Obstructive sleep apnea (adult) (pediatric): Secondary | ICD-10-CM

## 2018-06-22 LAB — BASIC METABOLIC PANEL
BUN: 19 mg/dL (ref 6–23)
CALCIUM: 9.2 mg/dL (ref 8.4–10.5)
CO2: 31 mEq/L (ref 19–32)
Chloride: 96 mEq/L (ref 96–112)
Creatinine, Ser: 1.13 mg/dL (ref 0.40–1.50)
GFR: 67.49 mL/min (ref >60.00)
Glucose, Bld: 150 mg/dL — ABNORMAL HIGH (ref 70–99)
Potassium: 4.4 mEq/L (ref 3.5–5.1)
Sodium: 135 mEq/L (ref 135–145)

## 2018-06-22 LAB — CBC WITH DIFFERENTIAL/PLATELET
Basophils Absolute: 0.1 10*3/uL (ref 0.0–0.1)
Basophils Relative: 0.8 % (ref 0.0–3.0)
EOS ABS: 0.2 10*3/uL (ref 0.0–0.7)
Eosinophils Relative: 1.8 % (ref 0.0–5.0)
HCT: 39.5 % (ref 39.0–52.0)
HEMOGLOBIN: 13.4 g/dL (ref 13.0–17.0)
Lymphocytes Relative: 14.1 % (ref 12.0–46.0)
Lymphs Abs: 1.5 10*3/uL (ref 0.7–4.0)
MCHC: 34.1 g/dL (ref 30.0–36.0)
MCV: 93.8 fl (ref 78.0–100.0)
MONO ABS: 0.9 10*3/uL (ref 0.1–1.0)
Monocytes Relative: 8.1 % (ref 3.0–12.0)
NEUTROS PCT: 75.2 % (ref 43.0–77.0)
Neutro Abs: 8.1 10*3/uL — ABNORMAL HIGH (ref 1.4–7.7)
Platelets: 302 10*3/uL (ref 150.0–400.0)
RBC: 4.21 Mil/uL — AB (ref 4.22–5.81)
RDW: 14.2 % (ref 11.5–15.5)
WBC: 10.7 10*3/uL — AB (ref 4.0–10.5)

## 2018-06-22 NOTE — Assessment & Plan Note (Signed)
He will need time to recover from this exacerbation.  Medications are appropriate for now. Plan-flu vaccine-senior, add humidifier to home concentrator,

## 2018-06-22 NOTE — Assessment & Plan Note (Signed)
He continues CPAP 8/[facility]

## 2018-06-22 NOTE — Telephone Encounter (Signed)
Spoke with patient's wife-understands that she will need to contact GI about apt changes and CY had CT Chest report when he seen patient today. Nothing more needed at this time.

## 2018-06-22 NOTE — Assessment & Plan Note (Signed)
Plan-labs today for CBC and BMET

## 2018-06-22 NOTE — Assessment & Plan Note (Signed)
This might reflect his severe COPD but may also indicate neoplasm as discussed. Plan- BMET

## 2018-06-22 NOTE — Patient Instructions (Addendum)
Print script for compressor nebulizer machine of choice,     Dx COPD mixed type  Order- DME- Overnight oximetry on CPAP with room air     Order- DME- please add humidifier to home O2 concentrator  Order- DME - add portable O2- light system/ POC- 2L pulse    Dx chronic respiratory failure with hypoxia  Order Flu vax senior   Order- lab- CBC w diff, BMET    Dx COPD mixed type  Consider trying OTC nasal saline gel as needed for dry nose/ nose bleeds  Order- We will help you get [place] a disk with the download images of your CT chest from [facility]

## 2018-06-22 NOTE — Assessment & Plan Note (Signed)
The right upper lobe nodule is too small to assess at this time, but the mediastinal adenopathy is worrisome.  Hyponatremia and 15 pound reported weight loss might just reflect his severe COPD but could also indicate neoplasm.  He and his wife understand this.  Since he has been acutely ill, I will give a short observation interval allowing time for reactive adenopathy to calm down.  I will see him in a month planning to order a PET scan at that time.

## 2018-06-22 NOTE — Progress Notes (Signed)
Patient ID: Gerald [name], male    DOB: 20-Apr-1946, 72 y.o.   MRN: [id number]  HPI Male former heavy smoker followed for COPD complicated by CAD PFT 40/98/1191[name] very severe obstructive airways disease with insignificant response to bronchodilator. FVC 2.97/76%, FEV1 0.83/77%, ratio 0.28, TLC 115%, DLCO 52% Walk Test on room air 12/18/2016-92% at rest, 93%, 94%, 93% walking 3 laps at brisk pace Walk Test 06/22/2018-nadir 88%, improving to 95% on O2 2 L, qualifying for portable O2 ------------------------------------------------------------------------------------------------------  06/06/2018- 72 year-old male former smoker followed for COPD, hypoxic respiratory failure, complicated by CAD, OSA( [place]) CPAP 8/ [facility]    Also O2 2L used prn- home concentrator Returns for follow-up after acute visit for sinusitis  Combivent Respimat, Anoro Ellipta, nebulizer Pulmicort Has 2 days left of prednisone from taper.  Feeling much better.  Continues nebulizer treatment twice daily and is interested in getting a small portable nebulizer device because medication delivery is much better than with metered inhaler. He will get high-dose flu vaccine from his PCP. CXR 05/31/2018 IMPRESSION: No edema or consolidation. Areas of scattered scarring, stable. Lungs hyperexpanded. Stable cardiac silhouette. There is aortic atherosclerosis. Aortic Atherosclerosis (ICD10-I70.0).  06/22/2018- 72 year-old male former smoker followed for COPD, hypoxic respiratory failure, complicated by CAD, OSA( [place]) CPAP 8/ [facility]    Also O2 2L used prn- home concentrator -----Post Hospital-lung mass on CT Chest  Exacerbation of COPD over past 6 weeks approximately.  Hospitalized in [place] by PCP for low sodium.  Transferred to [facility] 1 week ago, approximately 10/7.  Still has some deep nonproductive cough and feels weak, but improved.  Has been trying to get a small portable nebulizer machine  to make travel easier and we can give a printed prescription.   Arrived today on room air with saturation 88% and qualified for portable O2, improving on 2 L to 95%.  He has a concentrator at home, 73 or 72 years old, but has been using his CPAP on room air. Uses CPAP every night and sleep quality improved.  CT chest 06/15/18- [place]-report describes mediastinal adenopathy, stellate 5 mm nodule right upper lobe-possible scarring, patchy opacity with branching suggestive of atypical pneumonia left upper lobe, multilevel mild compression fractures in mid thoracic spine-probably not new.  Review of Systems- See HPI    + = positive Constitutional:   +weight loss, night sweats,  Fevers, chills, fatigue, lassitude. HEENT:   No headaches,  Difficulty swallowing,  Tooth/dental problems,  Sore throat,                No sneezing, itching, ear ache,                 nasal congestion, post nasal drip,  CV:  No chest pain,  +Orthopnea, PND, , anasarca, dizziness, palpitations GI  No heartburn, indigestion, abdominal pain, nausea, vomiting, diarrhea,  Resp:.   + Short of breath, no change in color of mucus.  No wheezing. Skin: no rash or lesions. GU: . MS:  No joint pain or swelling.  No decreased range of motion.  Psych:  No change in mood or affect. No depression or anxiety.  No memory loss.   Objective:   Physical Exam General- Alert, Oriented, Affect-appropriate, Distress- none acute, + wearing our O2 at 2 L Skin- rash-none, lesions- none, excoriation- none Lymphadenopathy- none Head- atraumatic            Eyes- Gross vision intact, PERRLA, conjunctivae clear secretions  Ears- Hearing aids            Nose- Clear, no-Septal dev, mucus, polyps, erosion, perforation             Throat- Mallampati II-III , mucosa clear , drainage- none, tonsils- atrophic,  Neck- flexible , trachea midline, no stridor , thyroid nl, carotid no bruit Chest - symmetrical excursion , unlabored            Heart/CV- RRR , no murmur , no gallop  , no rub, nl s1 s2                           - JVD- none , edema + trace, stasis changes- none, varices- none           Lung- +distant/Clear , unlabored, wheeze-none, cough + deep but without rhonchi, dullness-none, rub- none.            Chest wall-  Abd-  Br/ Gen/ Rectal- Not done, not indicated Extrem- cyanosis- none, clubbing, none, atrophy- none, strength- nl Neuro- grossly intact to observation

## 2018-06-22 NOTE — Assessment & Plan Note (Signed)
He has had a home concentrator but has not been using it at night or with exertion outside the home. Plan-overnight oximetry on room air with CPAP.  Further documentation may be required.  Add portable oxygen at 2 L pulse, probably a light POC.  Add humidification to home concentrator.

## 2018-06-23 ENCOUNTER — Telehealth: Payer: Self-pay | Admitting: Internal Medicine

## 2018-06-23 DIAGNOSIS — J449 Chronic obstructive pulmonary disease, unspecified: Secondary | ICD-10-CM

## 2018-06-23 NOTE — Telephone Encounter (Signed)
Spoke with pt. He has bought a POC, he needs a prescription sent to [facility] in [place]. Per our documentation, this order was sent to [facility], pt states this is incorrect. A new order has been placed. Nothing further was needed.

## 2018-07-01 ENCOUNTER — Telehealth: Payer: Self-pay | Admitting: Internal Medicine

## 2018-07-01 NOTE — Telephone Encounter (Signed)
Called and spoke with pt who states he is still wheezing.  Pt states he is on O2 24/7 but when he takes the O2 off to go use the bathroom, he will begin to wheeze before he will even get to the bathroom which is just about 32feet away.  Pt states he is using the CPAP at night.  Pt states at last OV with CY, he was walked and he stated during the walk, O2 sats stayed at 88%. Pt states he is on 2.5L continuous.  Pt states he is becoming SOB more easily now and more frequently and is having to use his combivent more often.  Dr. Annamaria Boots, please advise on recs for pt. Thanks!  No Known Allergies   Current Outpatient Medications:  .  alendronate (FOSAMAX) 70 MG tablet, Take 70 mg by mouth every 7 (seven) days. , Disp: , Rfl:  .  ANORO ELLIPTA 62.5-25 MCG/INH AEPB, USE 1 INHALATION DAILY (Patient taking differently: Inhale 1 puff into the lungs daily. ), Disp: 180 each, Rfl: 2 .  aspirin 81 MG tablet, Take 81 mg by mouth daily. , Disp: , Rfl:  .  atorvastatin (LIPITOR) 80 MG tablet, Take 80 mg by mouth daily.  , Disp: , Rfl:  .  budesonide (PULMICORT) 0.5 MG/2ML nebulizer solution, Take 2 mLs (0.5 mg total) by nebulization 2 (two) times daily., Disp: 120 mL, Rfl: 5 .  Calcium Carbonate-Vitamin D (CALCIUM 600+D) 600-400 MG-UNIT per tablet, Take 1 tablet by mouth daily.  , Disp: , Rfl:  .  glipiZIDE (GLUCOTROL) 5 MG tablet, Take 5 mg by mouth daily before breakfast., Disp: , Rfl:  .  Ipratropium-Albuterol (COMBIVENT RESPIMAT) 20-100 MCG/ACT AERS respimat, USE 1 INHALATION EVERY 6 HOURS AS NEEDED FOR WHEEZING OR SHORTNESS OF BREATH, Disp: 12 g, Rfl: 1 .  losartan (COZAAR) 25 MG tablet, Take 1 tablet by mouth daily., Disp: , Rfl:  .  metFORMIN (GLUCOPHAGE) 500 MG tablet, Take 1,000 mg by mouth 2 (two) times daily. , Disp: , Rfl:  .  metoprolol tartrate (LOPRESSOR) 25 MG tablet, Take 1 tablet (25 mg total) by mouth 2 (two) times daily., Disp: 60 tablet, Rfl: 0 .  omeprazole (PRILOSEC) 20 MG capsule,  Take 20 mg by mouth daily. , Disp: , Rfl:  .  Respiratory Therapy Supplies (FLUTTER) DEVI, Blow through 4 times per set, 3 sets per day, Disp: 1 each, Rfl: 0 .  traZODone (DESYREL) 100 MG tablet, Take 50 mg by mouth at bedtime. , Disp: , Rfl:

## 2018-07-01 NOTE — Telephone Encounter (Signed)
Spoke with the pt  He states he does have the POC  I advised him of recs per [name]  He verbalized understanding  Nothing further needed

## 2018-07-01 NOTE — Telephone Encounter (Signed)
At last visit we ordered portable O2- did that not get done? He needs to stay on oxygen at 2-3 L even in house, going room to room.

## 2018-07-14 ENCOUNTER — Encounter: Payer: Medicare Other | Admitting: Internal Medicine

## 2018-07-22 ENCOUNTER — Telehealth: Payer: Self-pay | Admitting: Internal Medicine

## 2018-07-22 MED ORDER — IPRATROPIUM-ALBUTEROL 20-100 MCG/ACT IN AERS: INHALATION_SPRAY | RESPIRATORY_TRACT | 1 refills | Status: DC

## 2018-07-22 NOTE — Telephone Encounter (Signed)
Called and spoke with patient he is requesting a refill of [facility] Refill sent. Nothing further needed/.

## 2018-07-25 ENCOUNTER — Ambulatory Visit (INDEPENDENT_AMBULATORY_CARE_PROVIDER_SITE_OTHER): Payer: Medicare Other | Admitting: Internal Medicine

## 2018-07-25 ENCOUNTER — Encounter: Payer: Self-pay | Admitting: Internal Medicine

## 2018-07-25 VITALS — BP 124/82 | HR 80 | Ht 68.0 in | Wt 155.2 lb

## 2018-07-25 DIAGNOSIS — G4733 Obstructive sleep apnea (adult) (pediatric): Secondary | ICD-10-CM | POA: Diagnosis not present

## 2018-07-25 DIAGNOSIS — J449 Chronic obstructive pulmonary disease, unspecified: Secondary | ICD-10-CM | POA: Diagnosis not present

## 2018-07-25 DIAGNOSIS — R634 Abnormal weight loss: Secondary | ICD-10-CM

## 2018-07-25 DIAGNOSIS — R911 Solitary pulmonary nodule: Secondary | ICD-10-CM | POA: Diagnosis not present

## 2018-07-25 DIAGNOSIS — J9611 Chronic respiratory failure with hypoxia: Secondary | ICD-10-CM

## 2018-07-25 MED ORDER — IPRATROPIUM-ALBUTEROL 20-100 MCG/ACT IN AERS: INHALATION_SPRAY | RESPIRATORY_TRACT | 4 refills | Status: AC

## 2018-07-25 NOTE — Patient Instructions (Signed)
Refill sent for Combivent rescue inhaler  Order- PET scan neck to thigh   Dx Right upper lobe nodule, weight loss  Please call if we can help

## 2018-07-25 NOTE — Assessment & Plan Note (Signed)
He continues to benefit from CPAP.  Our records shows setting 8 CWP

## 2018-07-25 NOTE — Assessment & Plan Note (Signed)
Lung nodule and this former heavy smoker with advanced COPD and weight loss. PET scan advised to assess activity of small nodule but also to look for additional active areas.  He is high risk.

## 2018-07-25 NOTE — Progress Notes (Signed)
Patient ID: Gerald [name], male    DOB: 1945/12/15, 72 y.o.   MRN: [id number]  HPI Male former heavy smoker followed for COPD complicated by CAD PFT 24/09/7353[name] very severe obstructive airways disease with insignificant response to bronchodilator. FVC 2.97/76%, FEV1 0.83/77%, ratio 0.28, TLC 115%, DLCO 52% Walk Test on room air 12/18/2016-92% at rest, 93%, 94%, 93% walking 3 laps at brisk pace Walk Test 06/22/2018-nadir 88%, improving to 95% on O2 2 L, qualifying for portable O2 ------------------------------------------------------------------------------------------------------  06/22/2018- 47 year-old male former smoker followed for COPD, hypoxic respiratory failure, complicated by CAD, OSA( [place]) CPAP 8/ [facility]    Also O2 2L used prn- home concentrator -----Post Hospital-lung mass on CT Chest  Exacerbation of COPD over past 6 weeks approximately.  Hospitalized in [place] by PCP for low sodium.  Transferred to [facility] 1 week ago, approximately 10/7.  Still has some deep nonproductive cough and feels weak, but improved.  Has been trying to get a small portable nebulizer machine to make travel easier and we can give a printed prescription.   Arrived today on room air with saturation 88% and qualified for portable O2, improving on 2 L to 95%.  He has a concentrator at home, 73 or 72 years old, but has been using his CPAP on room air. Uses CPAP every night and sleep quality improved.  CT chest 06/15/18- [place]-report describes mediastinal adenopathy, stellate 5 mm nodule right upper lobe-possible scarring, patchy opacity with branching suggestive of atypical pneumonia left upper lobe, multilevel mild compression fractures in mid thoracic spine-probably not new.  07/25/2018- 74 year-old male former smoker followed for COPD, hypoxic respiratory failure, complicated by CAD, OSA( [place]) CPAP 8/ [facility]    Also O2 2L used prn- home  concentrator -----COPD mixed type-breathing about the same, SOB with exertion. Here with wife. Combivent Respimat, Anoro Ellipta, neb Pulmicort Asks refill of Combivent which we discussed-he works through [employer], community order. Turns oxygen up to 3-4 L in the morning until he is up and going.  Morning cough with clear phlegm.  Currently feels stable with some very good days.  Impacted by arthritic pains-mentions right shoulder. Gradual weight loss, 4 pounds since last here.  Denies blood, adenopathy, purulent discharge.  Review of Systems- See HPI    + = positive Constitutional:   +weight loss, night sweats,  Fevers, chills, fatigue, lassitude. HEENT:   No headaches,  Difficulty swallowing,  Tooth/dental problems,  Sore throat,                No sneezing, itching, ear ache,                 nasal congestion, post nasal drip,  CV:  No chest pain,  +Orthopnea, PND, , anasarca, dizziness, palpitations GI  No heartburn, indigestion, abdominal pain, nausea, vomiting, diarrhea,  Resp:.   + Short of breath, no change in color of mucus.  No wheezing. Skin: no rash or lesions. GU: . MS:  No joint pain or swelling.  No decreased range of motion.  Psych:  No change in mood or affect. No depression or anxiety.  No memory loss.   Objective:   Physical Exam General- Alert, Oriented, Affect-appropriate, Distress- none acute, + wearing our O2 at 2 L Skin- rash-none, lesions- none, excoriation- none Lymphadenopathy- none Head- atraumatic            Eyes- Gross vision intact, PERRLA, conjunctivae clear secretions  Ears- Hearing aids            Nose- Clear, no-Septal dev, mucus, polyps, erosion, perforation             Throat- Mallampati II-III , mucosa clear , drainage- none, tonsils- atrophic,  Neck- flexible , trachea midline, no stridor , thyroid nl, carotid no bruit Chest - symmetrical excursion , unlabored           Heart/CV- RRR , no murmur , no gallop  , no rub, nl s1 s2                            - JVD- none , edema + trace, stasis changes- none, varices- none           Lung- +distant/Clear , unlabored, wheeze-none, cough- light, dullness-none, rub- none.            Chest wall-  Abd-  Br/ Gen/ Rectal- Not done, not indicated Extrem- cyanosis- none, clubbing, none, atrophy- none, strength- nl Neuro- grossly intact to observation

## 2018-07-25 NOTE — Assessment & Plan Note (Signed)
He remains dependent on oxygen and I do not foresee this changing.  We discussed adjusting flow rate based on his level of activity.  Okay to go up to 4 L when he first gets going in the morning.

## 2018-07-25 NOTE — Assessment & Plan Note (Signed)
Currently near baseline.  I think he is getting maximum potential benefit from his bronchodilator inhalers. Plan-refill Combivent

## 2018-07-29 ENCOUNTER — Ambulatory Visit (HOSPITAL_COMMUNITY)
Admission: RE | Admit: 2018-07-29 | Discharge: 2018-07-29 | Disposition: A | Discharge status: Home / Self Care | Payer: Medicare Other | Source: Ambulatory Visit | Attending: Internal Medicine | Admitting: Internal Medicine

## 2018-07-29 DIAGNOSIS — R634 Abnormal weight loss: Secondary | ICD-10-CM | POA: Diagnosis present

## 2018-07-29 DIAGNOSIS — R911 Solitary pulmonary nodule: Secondary | ICD-10-CM | POA: Diagnosis not present

## 2018-07-29 LAB — GLUCOSE, CAPILLARY: Glucose-Capillary: 60 mg/dL — ABNORMAL LOW (ref 70–99)

## 2018-07-29 MED ORDER — FLUDEOXYGLUCOSE F - 18 (FDG) INJECTION
8.1000 | Freq: Once | INTRAVENOUS | Status: AC | PRN
  Administered 2018-07-29: 8.1 via INTRAVENOUS

## 2018-07-31 ENCOUNTER — Telehealth: Payer: Self-pay | Admitting: Internal Medicine

## 2018-07-31 NOTE — Telephone Encounter (Signed)
I attempted to call about PET scan. Same number listed for home and mobile. Mailbox full, unable to leave message.

## 2018-08-01 ENCOUNTER — Telehealth: Payer: Self-pay | Admitting: Internal Medicine

## 2018-08-01 DIAGNOSIS — R948 Abnormal results of function studies of other organs and systems: Secondary | ICD-10-CM

## 2018-08-01 DIAGNOSIS — R942 Abnormal results of pulmonary function studies: Secondary | ICD-10-CM

## 2018-08-01 NOTE — Addendum Note (Signed)
Addended by: Vivia Ewing on: 08/01/2018 03:11 PM   Modules accepted: Orders

## 2018-08-01 NOTE — Addendum Note (Signed)
Addended by: Vivia Ewing on: 08/01/2018 05:21 PM   Modules accepted: Orders

## 2018-08-01 NOTE — Telephone Encounter (Signed)
Correct order placed for CT guided biopsy, attempted to call IR to D/C previous order and cancel however department as closed. Will attempt to call back in the AM to D/C order.

## 2018-08-01 NOTE — Telephone Encounter (Signed)
CY please advise would you like labs? If so which labs? The order for the Ct biopsy requires [place] to choose whether or not the provider would like labs done.

## 2018-08-01 NOTE — Addendum Note (Signed)
Addended by: Vivia Ewing on: 08/01/2018 04:15 PM   Modules accepted: Orders

## 2018-08-01 NOTE — Telephone Encounter (Signed)
I reached Mrs [facility] on file). 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 He has past hs renal and bladder  Cancers, but we don't know current primary. She understands next step is  Biopsy.  Order- please consult IR for needle biopsy of bone or node. Dx abnormal PET scan.

## 2018-08-02 ENCOUNTER — Other Ambulatory Visit: Payer: Self-pay | Admitting: Internal Medicine

## 2018-08-02 ENCOUNTER — Telehealth: Payer: Self-pay

## 2018-08-02 ENCOUNTER — Telehealth: Payer: Self-pay | Admitting: Internal Medicine

## 2018-08-02 DIAGNOSIS — R942 Abnormal results of pulmonary function studies: Secondary | ICD-10-CM

## 2018-08-02 MED ORDER — TRAMADOL HCL 50 MG PO TABS: 50.0000 mg | ORAL_TABLET | Freq: Four times a day (QID) | ORAL | 0 refills | Status: DC | PRN

## 2018-08-02 MED ORDER — TRAMADOL HCL 50 MG PO TABS: 50.0000 mg | ORAL_TABLET | Freq: Four times a day (QID) | ORAL | 0 refills | Status: AC | PRN

## 2018-08-02 NOTE — Telephone Encounter (Signed)
Called and spoke with pharmacist. Prescription is ready. Patient is aware. Nothing further needed.

## 2018-08-02 NOTE — Telephone Encounter (Signed)
Called and made patient aware order has been placed for CT biopsy. Patient states while on phone that he is having severe back pain, states that it is causing him to moan and groan with the smallest movement. Rates pain 8 out of 10. Patient is requesting something for pain.   CY please advise if patient can have something for pain? Please send response to triage. Thanks.

## 2018-08-02 NOTE — Telephone Encounter (Signed)
Offer Tramadol 50 mg, # 50    1 or 2 every 6 hours if needed for pain

## 2018-08-02 NOTE — Telephone Encounter (Signed)
Called and spoke with patient, prescription has been sent in. Nothing further needed.

## 2018-08-02 NOTE — Addendum Note (Signed)
Addended by: Vivia Ewing on: 08/02/2018 09:40 AM   Modules accepted: Orders

## 2018-08-03 ENCOUNTER — Telehealth: Payer: Self-pay | Admitting: Internal Medicine

## 2018-08-03 ENCOUNTER — Ambulatory Visit (HOSPITAL_COMMUNITY): Payer: Medicare Other

## 2018-08-03 NOTE — Telephone Encounter (Signed)
Called and spoke with Patient.  Per [place], [facility], it is review with IR.  Someone should be contacting him once it is approved or denied.  PPatient stated understanding.  Nothing further needed.

## 2018-08-03 NOTE — Telephone Encounter (Signed)
Called and spoke to patient, made aware it was in review per [facility]. Made patient aware once we were notified of his approval or denial we would make him aware. Voiced understanding. Nothing further is needed at this time.

## 2018-08-10 ENCOUNTER — Other Ambulatory Visit: Payer: Self-pay | Admitting: Radiology

## 2018-08-12 ENCOUNTER — Encounter (HOSPITAL_COMMUNITY): Payer: Self-pay

## 2018-08-12 ENCOUNTER — Ambulatory Visit (HOSPITAL_COMMUNITY)
Admission: RE | Admit: 2018-08-12 | Discharge: 2018-08-12 | Disposition: A | Discharge status: Home / Self Care | Payer: Medicare Other | Source: Ambulatory Visit | Attending: Internal Medicine | Admitting: Internal Medicine

## 2018-08-12 DIAGNOSIS — Z79899 Other long term (current) drug therapy: Secondary | ICD-10-CM | POA: Diagnosis not present

## 2018-08-12 DIAGNOSIS — C7951 Secondary malignant neoplasm of bone: Secondary | ICD-10-CM | POA: Insufficient documentation

## 2018-08-12 DIAGNOSIS — J449 Chronic obstructive pulmonary disease, unspecified: Secondary | ICD-10-CM | POA: Insufficient documentation

## 2018-08-12 DIAGNOSIS — K769 Liver disease, unspecified: Secondary | ICD-10-CM | POA: Insufficient documentation

## 2018-08-12 DIAGNOSIS — Z8551 Personal history of malignant neoplasm of bladder: Secondary | ICD-10-CM | POA: Diagnosis not present

## 2018-08-12 DIAGNOSIS — I700 Atherosclerosis of aorta: Secondary | ICD-10-CM | POA: Insufficient documentation

## 2018-08-12 DIAGNOSIS — K8020 Calculus of gallbladder without cholecystitis without obstruction: Secondary | ICD-10-CM | POA: Insufficient documentation

## 2018-08-12 DIAGNOSIS — C3492 Malignant neoplasm of unspecified part of left bronchus or lung: Secondary | ICD-10-CM | POA: Diagnosis not present

## 2018-08-12 DIAGNOSIS — R942 Abnormal results of pulmonary function studies: Secondary | ICD-10-CM

## 2018-08-12 DIAGNOSIS — Z7982 Long term (current) use of aspirin: Secondary | ICD-10-CM | POA: Insufficient documentation

## 2018-08-12 DIAGNOSIS — Z7984 Long term (current) use of oral hypoglycemic drugs: Secondary | ICD-10-CM | POA: Insufficient documentation

## 2018-08-12 DIAGNOSIS — Z87891 Personal history of nicotine dependence: Secondary | ICD-10-CM | POA: Insufficient documentation

## 2018-08-12 DIAGNOSIS — I2510 Atherosclerotic heart disease of native coronary artery without angina pectoris: Secondary | ICD-10-CM | POA: Diagnosis not present

## 2018-08-12 LAB — APTT: APTT: 31 s (ref 24–36)

## 2018-08-12 LAB — CBC
HCT: 37.5 % — ABNORMAL LOW (ref 39.0–52.0)
Hemoglobin: 13 g/dL (ref 13.0–17.0)
MCH: 33.2 pg (ref 26.0–34.0)
MCHC: 34.7 g/dL (ref 30.0–36.0)
MCV: 95.9 fL (ref 80.0–100.0)
Platelets: 142 10*3/uL — ABNORMAL LOW (ref 150–400)
RBC: 3.91 MIL/uL — ABNORMAL LOW (ref 4.22–5.81)
RDW: 15.9 % — ABNORMAL HIGH (ref 11.5–15.5)
WBC: 7.9 10*3/uL (ref 4.0–10.5)
nRBC: 0.3 % — ABNORMAL HIGH (ref 0.0–0.2)

## 2018-08-12 LAB — PROTIME-INR
INR: 1.12
Prothrombin Time: 14.3 seconds (ref 11.4–15.2)

## 2018-08-12 MED ORDER — MIDAZOLAM HCL 2 MG/2ML IJ SOLN
INTRAMUSCULAR | Status: AC | PRN
  Administered 2018-08-12: 1 mg via INTRAVENOUS

## 2018-08-12 MED ORDER — LIDOCAINE HCL 1 % IJ SOLN
INTRAMUSCULAR | Status: AC
  Filled 2018-08-12: qty 20

## 2018-08-12 MED ORDER — SODIUM CHLORIDE 0.9 % IV SOLN
Freq: Once | INTRAVENOUS | Status: AC
  Administered 2018-08-12: 11:00:00 via INTRAVENOUS

## 2018-08-12 MED ORDER — MIDAZOLAM HCL 2 MG/2ML IJ SOLN
INTRAMUSCULAR | Status: AC
  Filled 2018-08-12: qty 2

## 2018-08-12 MED ORDER — FENTANYL CITRATE (PF) 100 MCG/2ML IJ SOLN
INTRAMUSCULAR | Status: AC
  Filled 2018-08-12: qty 2

## 2018-08-12 MED ORDER — DEXTROSE 50 % IV SOLN
INTRAVENOUS | Status: AC
  Filled 2018-08-12: qty 50

## 2018-08-12 MED ORDER — LIDOCAINE HCL (PF) 1 % IJ SOLN
INTRAMUSCULAR | Status: AC | PRN
  Administered 2018-08-12: 5 mL

## 2018-08-12 MED ORDER — DEXTROSE 5 % IV SOLN
INTRAVENOUS | Status: DC
  Administered 2018-08-12: 12:00:00 via INTRAVENOUS

## 2018-08-12 MED ORDER — FENTANYL CITRATE (PF) 100 MCG/2ML IJ SOLN
INTRAMUSCULAR | Status: AC | PRN
  Administered 2018-08-12: 50 ug via INTRAVENOUS

## 2018-08-12 MED ORDER — DEXTROSE 50 % IV SOLN
25.0000 mL | INTRAVENOUS | Status: AC
  Administered 2018-08-12: 25 mL via INTRAVENOUS

## 2018-08-12 MED ORDER — HYDROCODONE-ACETAMINOPHEN 5-325 MG PO TABS: 1.0000 | ORAL_TABLET | ORAL | Status: DC | PRN

## 2018-08-12 NOTE — Consult Note (Signed)
Chief Complaint: Patient was seen in consultation today for image guided left supraclavicular lymph node biopsy  Referring Physician(s): Young,Clinton D  Supervising Physician: Arne Cleveland  Patient Status: [facility] - Out-pt  History of Present Illness: Gerald [name] is a 72 y.o. male, former smoker, with remote history of right nephrectomy for cancer as well as bladder cancer, status post BCG treatment. PMH also sig for COPD/CAD. Recent outside imaging has revealed lung nodules and mediastinal adenopathy.  PET scan performed on 07/30/2018 revealed: 1. Innumerable markedly hypermetabolic bone metastases associated with hypermetabolic metastatic lymph nodes in the left supraclavicular space, mediastinum, and left hilum. Hypermetabolic liver lesions are evident. There is a tiny hypermetabolic nodule in the left retroperitoneal fat adjacent to the left kidney. 2 hypermetabolic nodules are evident in the left lung. Given prior right nephrectomy, metastatic renal cell carcinoma would be a consideration. The patient also has a reported history of bladder cancer and metastatic disease from this primary neoplasm would also be a consideration. Given the tiny size of the left lung nodules, these are felt to be more likely metastatic than primary, but metastatic disease from lung primary is not completely excluded. 2. Small hypermetabolic focus distal esophagus without esophageal mass lesion by CT imaging. This could be related to esophagitis but esophageal primary neoplasm remains a possibility. 3. Cholelithiasis 4.  Aortic Atherosclerois (ICD10-170.  Request now received for left supraclavicular lymph node biopsy for further evaluation.   Past Medical History:  Diagnosis Date  . Bladder cancer (HCC)    BCG treatments  . CAD (coronary artery disease)   . COPD (chronic obstructive pulmonary disease) ([place])   . Heart attack ([place])   . Kidney disease     Past Surgical History:    Procedure Laterality Date  . CORONARY STENT PLACEMENT    . KIDNEY SURGERY  06-2009   right nephrectomy 2009 for bladder Ca    Allergies: Patient has no known allergies.  Medications: Prior to Admission medications   Medication Sig Start Date End Date Taking? Authorizing Provider  ANORO ELLIPTA 62.5-25 MCG/INH AEPB USE 1 INHALATION DAILY Patient taking differently: Inhale 1 puff into the lungs daily.  05/02/18  Yes Young, Tarri Fuller D, MD  atorvastatin (LIPITOR) 80 MG tablet Take 80 mg by mouth daily.     Yes [provider]  Calcium Carbonate-Vitamin D (CALCIUM 600+D) 600-400 MG-UNIT per tablet Take 1 tablet by mouth daily.     Yes [provider]  glipiZIDE (GLUCOTROL) 5 MG tablet Take 5 mg by mouth daily before breakfast.   Yes [provider]  Ipratropium-Albuterol (COMBIVENT RESPIMAT) 20-100 MCG/ACT AERS respimat USE 1 INHALATION EVERY 6 HOURS AS NEEDED FOR WHEEZING OR SHORTNESS OF BREATH 07/25/18  Yes Young, Clinton D, MD  losartan (COZAAR) 25 MG tablet Take 1 tablet by mouth daily. 11/04/14  Yes [provider]  metFORMIN (GLUCOPHAGE) 500 MG tablet Take 500 mg by mouth 2 (two) times daily.  06/07/15  Yes [provider]  omeprazole (PRILOSEC) 20 MG capsule Take 20 mg by mouth daily.  04/26/15  Yes [provider]  traMADol (ULTRAM) 50 MG tablet Take 1 tablet (50 mg total) by mouth every 6 (six) hours as needed. 08/02/18  Yes Young, Tarri Fuller D, MD  traZODone (DESYREL) 100 MG tablet Take 50 mg by mouth at bedtime.  06/07/15  Yes [provider]  alendronate (FOSAMAX) 70 MG tablet Take 70 mg by mouth every 7 (seven) days.  01/02/11   [provider]  aspirin 81 MG tablet Take 81 mg by mouth daily.     [provider]  budesonide (PULMICORT) 0.5 MG/2ML nebulizer solution Take 2 mLs (0.5 mg total) by nebulization 2 (two) times daily. 08/31/14 06/22/18  Baird Lyons D, MD  metoprolol tartrate (LOPRESSOR) 25 MG tablet  Take 1 tablet (25 mg total) by mouth 2 (two) times daily. 06/19/18   Thurnell Lose, MD  Respiratory Therapy Supplies (FLUTTER) DEVI Blow through 4 times per set, 3 sets per day 12/22/17   Deneise Lever, MD     Family History  Problem Relation Age of Onset  . Heart attack Mother 17       maternal side of family history    Social History   Socioeconomic History  . Marital status: Married    Spouse name: Not on file  . Number of children: Not on file  . Years of education: Not on file  . Highest education level: Not on file  Occupational History  . Not on file  Social Needs  . Financial resource strain: Not on file  . Food insecurity:    Worry: Not on file    Inability: Not on file  . Transportation needs:    Medical: Not on file    Non-medical: Not on file  Tobacco Use  . Smoking status: Former Smoker    Packs/day: 1.00    Years: 50.00    Pack years: 50.00    Types: Cigarettes    Last attempt to quit: 09/14/2008    Years since quitting: 9.9  . Smokeless tobacco: Never Used  Substance and Sexual Activity  . Alcohol use: No  . Drug use: No  . Sexual activity: Not on file  Lifestyle  . Physical activity:    Days per week: Not on file    Minutes per session: Not on file  . Stress: Not on file  Relationships  . Social connections:    Talks on phone: Not on file    Gets together: Not on file    Attends religious service: Not on file    Active member of club or organization: Not on file    Attends meetings of clubs or organizations: Not on file    Relationship status: Not on file  Other Topics Concern  . Not on file  Social History Narrative  . Not on file      Review of Systems: denies fever, headache, chest pain, abdominal pain, nausea, vomiting.  He does have some dyspnea with exertion, occasional cough, back pain and occ blood-streaked sputum  Vital Signs: BP (!) 161/82 (BP Location: Right Arm)   Pulse 98   Temp 97.7 F (36.5 C) (Oral)   Resp 20    SpO2 96%   Physical Exam awake, alert.  Chest with distant breath sounds bilaterally.  Heart with regular rate and rhythm.  Abdomen soft, protuberant, positive bowel sounds, nontender.  Bilateral lower extremity edema noted  Imaging: Nm Pet Image Initial (pi) Skull Base To Thigh  Result Date: 07/30/2018 CLINICAL DATA:  Initial treatment strategy for lung nodule. CT scan from [facility] reportedly documented mediastinal lymphadenopathy and right upper lobe nodule. History of bladder cancer. EXAM: NUCLEAR MEDICINE PET SKULL BASE TO THIGH TECHNIQUE: 8.1 mCi F-18 FDG was injected intravenously. Full-ring PET imaging was performed from the skull base to thigh after the radiotracer. CT data was obtained and used for attenuation correction and anatomic localization. Fasting blood glucose: 60 mg/dl COMPARISON:  None. FINDINGS: Mediastinal blood  pool activity: SUV max 1.4 NECK: No hypermetabolic lymph nodes in the neck. Incidental CT findings: none CHEST: Markedly hypermetabolic 11 mm short axis left supraclavicular lymph node (37/4) demonstrates SUV max = 34.9. Adjacent 9 mm short axis left supraclavicular lymph node is also hypermetabolic. Hypermetabolic mediastinal lymphadenopathy evident. 11 mm right paratracheal node (56/4) demonstrates SUV max = 30.8. 14 mm short axis precarinal node demonstrates SUV max = 38. Hypermetabolic lymphadenopathy evident in the left hilum. Discrete anterior left hilar lymph node measuring 17 mm short axis on 70/4 demonstrates SUV max = 9.2. No hypermetabolic axillary lymphadenopathy. No hypermetabolic lymphadenopathy in the right hilum. 11 mm left upper lobe nodule (99/3) is hypermetabolic with SUV max = 6. The 7 mm central left upper lobe nodule (71/6) is hypermetabolic with SUV max = 4. Focal hypermetabolic uptake is identified in the distal esophagus with SUV max = 5.4. No associated esophageal mass lesion evident. Incidental CT findings: Centrilobular and paraseptal emphysema  noted. Coronary artery calcification is evident. Atherosclerotic calcification is noted in the wall of the thoracic aorta. ABDOMEN/PELVIS: Multiple hypermetabolic liver lesions evident including lateral right liver with SUV max = 9.5. No underlying correlated of lesion evident by CT. 8 mm retroperitoneal nodule posterior to the lower pole left kidney (967/8) is hypermetabolic with SUV max = 5.3 Diffuse uptake of FDG is identified in small bowel loops of the pelvis. Incidental CT findings: Tiny gallstones are evident in the gallbladder. Right kidney is surgically absent. There is abdominal aortic atherosclerosis without aneurysm. Large right hydrocele evident. SKELETON: Innumerable hypermetabolic bone metastases are evident. Dominant large left sacroiliac lesion demonstrates SUV max = 34.8. These lesions are not well seen on bone windows of the CT images. Incidental CT findings: none IMPRESSION: 1. Innumerable markedly hypermetabolic bone metastases associated with hypermetabolic metastatic lymph nodes in the left supraclavicular space, mediastinum, and left hilum. Hypermetabolic liver lesions are evident. There is a tiny hypermetabolic nodule in the left retroperitoneal fat adjacent to the left kidney. 2 hypermetabolic nodules are evident in the left lung. Given prior right nephrectomy, metastatic renal cell carcinoma would be a consideration. The patient also has a reported history of bladder cancer and metastatic disease from this primary neoplasm would also be a consideration. Given the tiny size of the left lung nodules, these are felt to be more likely metastatic than primary, but metastatic disease from lung primary is not completely excluded. 2. Small hypermetabolic focus distal esophagus without esophageal mass lesion by CT imaging. This could be related to esophagitis but esophageal primary neoplasm remains a possibility. 3. Cholelithiasis 4.  Aortic Atherosclerois (ICD10-170.0) Electronically Signed   By:  Misty Stanley M.D.   On: 07/30/2018 20:28    Labs:  CBC: Recent Labs    06/17/18 0445 06/18/18 0318 06/22/18 1230 08/12/18 1049  WBC 12.4* 15.8* 10.7* 7.9  HGB 14.5 13.1 13.4 13.0  HCT 41.3 37.5* 39.5 37.5*  PLT 189 167 302.0 142*    COAGS: Recent Labs    06/17/18 0445 08/12/18 1049  INR 1.08 1.12  APTT  --  31    BMP: Recent Labs    06/16/18 2200  06/17/18 0445  06/18/18 0318 06/18/18 0628 06/18/18 1329 06/19/18 0558 06/22/18 1230  NA 129*   < > 132*   < > 130* 132* 133* 131* 135  K 3.8  --  4.7  --  4.3  --   --  3.8 4.4  CL 93*  --  95*  --  98  --   --  94* 96  CO2 23  --  27  --  21*  --   --  24 31  GLUCOSE 125*  --  191*  --  166*  --   --  141* 150*  BUN 10  --  5*  --  9  --   --  16 19  CALCIUM 8.3*  --  8.4*  --  7.9*  --   --  8.6* 9.2  CREATININE 0.92  --  1.07  --  1.17  --   --  1.27* 1.13  GFRNONAA >60  --  >60  --  >60  --   --  54*  --   GFRAA >60  --  >60  --  >60  --   --  >60  --    < > = values in this interval not displayed.    LIVER FUNCTION TESTS: No results for input(s): BILITOT, AST, ALT, ALKPHOS, PROT, ALBUMIN in the last 8760 hours.  TUMOR MARKERS: No results for input(s): AFPTM, CEA, CA199, CHROMGRNA in the last 8760 hours.  Assessment and Plan: 72 y.o. male, former smoker, with remote history of right nephrectomy for cancer as well as bladder cancer, status post BCG treatment. PMH also sig for COPD/CAD. Recent outside imaging has revealed lung nodules and mediastinal adenopathy.  PET scan performed on 07/30/2018 revealed: 1. Innumerable markedly hypermetabolic bone metastases associated with hypermetabolic metastatic lymph nodes in the left supraclavicular space, mediastinum, and left hilum. Hypermetabolic liver lesions are evident. There is a tiny hypermetabolic nodule in the left retroperitoneal fat adjacent to the left kidney. 2 hypermetabolic nodules are evident in the left lung. Given prior right nephrectomy,  metastatic renal cell carcinoma would be a consideration. The patient also has a reported history of bladder cancer and metastatic disease from this primary neoplasm would also be a consideration. Given the tiny size of the left lung nodules, these are felt to be more likely metastatic than primary, but metastatic disease from lung primary is not completely excluded. 2. Small hypermetabolic focus distal esophagus without esophageal mass lesion by CT imaging. This could be related to esophagitis but esophageal primary neoplasm remains a possibility. 3. Cholelithiasis 4.  Aortic Atherosclerois (ICD10-170.  Request now received for left supraclavicular lymph node biopsy for further evaluation.Risks and benefits discussed with the patient/family including, but not limited to bleeding, infection, damage to adjacent structures or low yield requiring additional tests.  All of the patient's questions were answered, patient is agreeable to proceed. Consent signed and in chart.     Thank you for this interesting consult.  I greatly enjoyed meeting Gerald [name] and look forward to participating in their care.  A copy of this report was sent to the requesting provider on this date.  Electronically Signed: D. Rowe Robert, PA-C 08/12/2018, 12:15 PM   I spent a total of 25 minutes    in face to face in clinical consultation, greater than 50% of which was counseling/coordinating care for image guided left supraclavicular lymph node biopsy

## 2018-08-12 NOTE — Discharge Instructions (Signed)
Moderate Conscious Sedation, Adult, Care After These instructions provide you with information about caring for yourself after your procedure. Your health care provider may also give you more specific instructions. Your treatment has been planned according to current medical practices, but problems sometimes occur. Call your health care provider if you have any problems or questions after your procedure. What can I expect after the procedure? After your procedure, it is common:  To feel sleepy for several hours.  To feel clumsy and have poor balance for several hours.  To have poor judgment for several hours.  To vomit if you eat too soon.  Follow these instructions at home: For at least 24 hours after the procedure:   Do not: ? Participate in activities where you could fall or become injured. ? Drive. ? Use heavy machinery. ? Drink alcohol. ? Take sleeping pills or medicines that cause drowsiness. ? Make important decisions or sign legal documents. ? Take care of children on your own.  Rest. Eating and drinking  Follow the diet recommended by your health care provider.  If you vomit: ? Drink water, juice, or soup when you can drink without vomiting. ? Make sure you have little or no nausea before eating solid foods. General instructions  Have a responsible adult stay with you until you are awake and alert.  Take over-the-counter and prescription medicines only as told by your health care provider.  If you smoke, do not smoke without supervision.  Keep all follow-up visits as told by your health care provider. This is important. Contact a health care provider if:  You keep feeling nauseous or you keep vomiting.  You feel light-headed.  You develop a rash.  You have a fever. Get help right away if:  You have trouble breathing. This information is not intended to replace advice given to you by your health care provider. Make sure you discuss any questions you have  with your health care provider. Document Released: 06/21/2013 Document Revised: 02/03/2016 Document Reviewed: 12/21/2015 Elsevier Interactive Patient Education  2018 [place], Adult Taking care of your wound properly can help to prevent pain and infection. It can also help your wound to heal more quickly. How is this treated? Wound care  Follow instructions from your health care provider about how to take care of your wound. Make sure you: ? Wash your hands with soap and water before you change the bandage (dressing). If soap and water are not available, use hand sanitizer. ? Change your dressing as told by your health care provider. ? Leave stitches (sutures), skin glue, or adhesive strips in place. These skin closures may need to stay in place for 2 weeks or longer. If adhesive strip edges start to loosen and curl up, you may trim the loose edges. Do not remove adhesive strips completely unless your health care provider tells you to do that.  Check your wound area every day for signs of infection. Check for: ? More redness, swelling, or pain. ? More fluid or blood. ? Warmth. ? Pus or a bad smell.  Ask your health care provider if you should clean the wound with mild soap and water. Doing this may include: ? Using a clean towel to pat the wound dry after cleaning it. Do not rub or scrub the wound. ? Applying a cream or ointment. Do this only as told by your health care provider. ? Covering the incision with a clean dressing.  Ask your health care  provider when you can leave the wound uncovered. Medicines   If you were prescribed an antibiotic medicine, cream, or ointment, take or use the antibiotic as told by your health care provider. Do not stop taking or using the antibiotic even if your condition improves.  Take over-the-counter and prescription medicines only as told by your health care provider. If you were prescribed pain medicine, take it at least 30  minutes before doing any wound care or as told by your health care provider. General instructions  Return to your normal activities as told by your health care provider. Ask your health care provider what activities are safe.  Do not scratch or pick at the wound.  Keep all follow-up visits as told by your health care provider. This is important.  Eat a diet that includes protein, vitamin A, vitamin C, and other nutrient-rich foods. These help the wound heal: ? Protein-rich foods include meat, dairy, beans, nuts, and other sources. ? Vitamin A-rich foods include carrots and dark green, leafy vegetables. ? Vitamin C-rich foods include citrus, tomatoes, and other fruits and vegetables. ? Nutrient-rich foods have protein, carbohydrates, fat, vitamins, or minerals. Eat a variety of healthy foods including vegetables, fruits, and whole grains. Contact a health care provider if:  You received a tetanus shot and you have swelling, severe pain, redness, or bleeding at the injection site.  Your pain is not controlled with medicine.  You have more redness, swelling, or pain around the wound.  You have more fluid or blood coming from the wound.  Your wound feels warm to the touch.  You have pus or a bad smell coming from the wound.  You have a fever or chills.  You are nauseous or you vomit.  You are dizzy. Get help right away if:  You have a red streak going away from your wound.  The edges of the wound open up and separate.  Your wound is bleeding and the bleeding does not stop with gentle pressure.  You have a rash.  You faint.  You have trouble breathing. This information is not intended to replace advice given to you by your health care provider. Make sure you discuss any questions you have with your health care provider. Document Released: 06/09/2008 Document Revised: 04/29/2016 Document Reviewed: 03/17/2016 Elsevier Interactive Patient Education  [address].  May  remove bandaid and shower or bathe in 24 hours. Keep site clean and dry. Replace bandaid as necessary.

## 2018-08-12 NOTE — Progress Notes (Signed)
S[place] 60, verbal order per Rowe Robert, PA, give 1/2 amp of d50 and may change ivf to d5w at [facility].  Pt is asympomatic, alert and talking. Pt states he did take his diabetic med this am, see Med list.

## 2018-08-12 NOTE — Procedures (Signed)
  Procedure: [place] FNA L supraclav LAN 21g x3 EBL:   minimal Complications:  none immediate  See full dictation in [facility].  Dillard Cannon MD Main # [phone] Pager  [phone]

## 2018-08-15 LAB — GLUCOSE, CAPILLARY: Glucose-Capillary: 61 mg/dL — ABNORMAL LOW (ref 70–99)

## 2018-08-16 ENCOUNTER — Telehealth: Payer: Self-pay | Admitting: Internal Medicine

## 2018-08-16 DIAGNOSIS — C3490 Malignant neoplasm of unspecified part of unspecified bronchus or lung: Secondary | ICD-10-CM

## 2018-08-16 NOTE — Addendum Note (Signed)
Addended by: Desmond Dike C on: 08/16/2018 12:29 PM   Modules accepted: Orders

## 2018-08-16 NOTE — Telephone Encounter (Signed)
Order has been placed per Dr. Annamaria Boots. Nothing further was needed.

## 2018-08-16 NOTE — Telephone Encounter (Signed)
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 Tramadol substantially helped back pain, which was probably a pathologic vertebral compression fracture.  He chooses treatment in [place].   Please order- ASAP Oncology referral for extensively metastatic small cell ca lung

## 2018-08-18 ENCOUNTER — Ambulatory Visit (HOSPITAL_COMMUNITY)
Admission: RE | Admit: 2018-08-18 | Discharge: 2018-08-18 | Disposition: A | Discharge status: Home / Self Care | Payer: Medicare Other | Source: Ambulatory Visit | Attending: Nurse Practitioner | Admitting: Nurse Practitioner

## 2018-08-18 ENCOUNTER — Inpatient Hospital Stay (HOSPITAL_COMMUNITY): Payer: Medicare Other

## 2018-08-18 ENCOUNTER — Other Ambulatory Visit: Payer: Self-pay

## 2018-08-18 ENCOUNTER — Encounter (HOSPITAL_COMMUNITY): Payer: Self-pay | Admitting: Hematology

## 2018-08-18 ENCOUNTER — Inpatient Hospital Stay (HOSPITAL_COMMUNITY): Payer: Medicare Other | Attending: Hematology | Admitting: Hematology

## 2018-08-18 VITALS — BP 109/56 | HR 125 | Temp 98.5°F | Resp 22 | Wt 147.2 lb

## 2018-08-18 DIAGNOSIS — M25519 Pain in unspecified shoulder: Secondary | ICD-10-CM | POA: Insufficient documentation

## 2018-08-18 DIAGNOSIS — Z87891 Personal history of nicotine dependence: Secondary | ICD-10-CM | POA: Insufficient documentation

## 2018-08-18 DIAGNOSIS — Z87312 Personal history of (healed) stress fracture: Secondary | ICD-10-CM | POA: Insufficient documentation

## 2018-08-18 DIAGNOSIS — G8929 Other chronic pain: Secondary | ICD-10-CM | POA: Insufficient documentation

## 2018-08-18 DIAGNOSIS — R531 Weakness: Secondary | ICD-10-CM | POA: Insufficient documentation

## 2018-08-18 DIAGNOSIS — M546 Pain in thoracic spine: Secondary | ICD-10-CM | POA: Insufficient documentation

## 2018-08-18 DIAGNOSIS — C3490 Malignant neoplasm of unspecified part of unspecified bronchus or lung: Secondary | ICD-10-CM | POA: Insufficient documentation

## 2018-08-18 DIAGNOSIS — I252 Old myocardial infarction: Secondary | ICD-10-CM | POA: Insufficient documentation

## 2018-08-18 DIAGNOSIS — C7951 Secondary malignant neoplasm of bone: Secondary | ICD-10-CM | POA: Insufficient documentation

## 2018-08-18 DIAGNOSIS — C801 Malignant (primary) neoplasm, unspecified: Secondary | ICD-10-CM

## 2018-08-18 DIAGNOSIS — C787 Secondary malignant neoplasm of liver and intrahepatic bile duct: Secondary | ICD-10-CM | POA: Diagnosis not present

## 2018-08-18 DIAGNOSIS — R634 Abnormal weight loss: Secondary | ICD-10-CM | POA: Insufficient documentation

## 2018-08-18 DIAGNOSIS — Z9981 Dependence on supplemental oxygen: Secondary | ICD-10-CM

## 2018-08-18 DIAGNOSIS — G4733 Obstructive sleep apnea (adult) (pediatric): Secondary | ICD-10-CM | POA: Insufficient documentation

## 2018-08-18 DIAGNOSIS — R5383 Other fatigue: Secondary | ICD-10-CM | POA: Insufficient documentation

## 2018-08-18 DIAGNOSIS — J449 Chronic obstructive pulmonary disease, unspecified: Secondary | ICD-10-CM | POA: Insufficient documentation

## 2018-08-18 DIAGNOSIS — Z79899 Other long term (current) drug therapy: Secondary | ICD-10-CM | POA: Insufficient documentation

## 2018-08-18 DIAGNOSIS — Z7984 Long term (current) use of oral hypoglycemic drugs: Secondary | ICD-10-CM | POA: Insufficient documentation

## 2018-08-18 DIAGNOSIS — Z8551 Personal history of malignant neoplasm of bladder: Secondary | ICD-10-CM | POA: Insufficient documentation

## 2018-08-18 DIAGNOSIS — E119 Type 2 diabetes mellitus without complications: Secondary | ICD-10-CM | POA: Insufficient documentation

## 2018-08-18 DIAGNOSIS — Z7952 Long term (current) use of systemic steroids: Secondary | ICD-10-CM | POA: Insufficient documentation

## 2018-08-18 DIAGNOSIS — Z801 Family history of malignant neoplasm of trachea, bronchus and lung: Secondary | ICD-10-CM

## 2018-08-18 DIAGNOSIS — Z9989 Dependence on other enabling machines and devices: Secondary | ICD-10-CM | POA: Insufficient documentation

## 2018-08-18 DIAGNOSIS — Z7982 Long term (current) use of aspirin: Secondary | ICD-10-CM | POA: Insufficient documentation

## 2018-08-18 DIAGNOSIS — Z905 Acquired absence of kidney: Secondary | ICD-10-CM | POA: Insufficient documentation

## 2018-08-18 DIAGNOSIS — Z7189 Other specified counseling: Secondary | ICD-10-CM | POA: Insufficient documentation

## 2018-08-18 DIAGNOSIS — I2510 Atherosclerotic heart disease of native coronary artery without angina pectoris: Secondary | ICD-10-CM | POA: Insufficient documentation

## 2018-08-18 DIAGNOSIS — R600 Localized edema: Secondary | ICD-10-CM | POA: Insufficient documentation

## 2018-08-18 LAB — COMPREHENSIVE METABOLIC PANEL
ALT: 122 U/L — ABNORMAL HIGH (ref 0–44)
AST: 108 U/L — ABNORMAL HIGH (ref 15–41)
Albumin: 3.7 g/dL (ref 3.5–5.0)
Alkaline Phosphatase: 250 U/L — ABNORMAL HIGH (ref 38–126)
Anion gap: >20 — ABNORMAL HIGH (ref 5–15)
BUN: 39 mg/dL — ABNORMAL HIGH (ref 8–23)
CO2: 11 mmol/L — ABNORMAL LOW (ref 22–32)
Calcium: 9.6 mg/dL (ref 8.9–10.3)
Chloride: 101 mmol/L (ref 98–111)
Creatinine, Ser: 1.66 mg/dL — ABNORMAL HIGH (ref 0.61–1.24)
GFR calc Af Amer: 47 mL/min — ABNORMAL LOW (ref >60)
GFR calc non Af Amer: 40 mL/min — ABNORMAL LOW (ref >60)
GLUCOSE: 156 mg/dL — AB (ref 70–99)
POTASSIUM: 5 mmol/L (ref 3.5–5.1)
Sodium: 135 mmol/L (ref 135–145)
Total Bilirubin: 1.8 mg/dL — ABNORMAL HIGH (ref 0.3–1.2)
Total Protein: 7.6 g/dL (ref 6.5–8.1)

## 2018-08-18 LAB — TSH: TSH: 1.844 u[IU]/mL (ref 0.350–4.500)

## 2018-08-18 LAB — CBC WITH DIFFERENTIAL/PLATELET
ABS IMMATURE GRANULOCYTES: 0.33 10*3/uL — AB (ref 0.00–0.07)
Basophils Absolute: 0.1 10*3/uL (ref 0.0–0.1)
Basophils Relative: 1 %
Eosinophils Absolute: 0 10*3/uL (ref 0.0–0.5)
Eosinophils Relative: 0 %
HCT: 40.4 % (ref 39.0–52.0)
Hemoglobin: 12.8 g/dL — ABNORMAL LOW (ref 13.0–17.0)
Immature Granulocytes: 3 %
Lymphocytes Relative: 4 %
Lymphs Abs: 0.5 10*3/uL — ABNORMAL LOW (ref 0.7–4.0)
MCH: 32.4 pg (ref 26.0–34.0)
MCHC: 31.7 g/dL (ref 30.0–36.0)
MCV: 102.3 fL — ABNORMAL HIGH (ref 80.0–100.0)
Monocytes Absolute: 0.9 10*3/uL (ref 0.1–1.0)
Monocytes Relative: 7 %
Neutro Abs: 11.2 10*3/uL — ABNORMAL HIGH (ref 1.7–7.7)
Neutrophils Relative %: 85 %
PLATELETS: 155 10*3/uL (ref 150–400)
RBC: 3.95 MIL/uL — ABNORMAL LOW (ref 4.22–5.81)
RDW: 17.6 % — ABNORMAL HIGH (ref 11.5–15.5)
WBC: 13.1 10*3/uL — ABNORMAL HIGH (ref 4.0–10.5)
nRBC: 0.3 % — ABNORMAL HIGH (ref 0.0–0.2)

## 2018-08-18 LAB — POCT I-STAT CREATININE: Creatinine, Ser: 1.4 mg/dL — ABNORMAL HIGH (ref 0.61–1.24)

## 2018-08-18 MED ORDER — GADOBUTROL 1 MMOL/ML IV SOLN
6.0000 mL | Freq: Once | INTRAVENOUS | Status: AC | PRN
  Administered 2018-08-18: 6 mL via INTRAVENOUS

## 2018-08-18 NOTE — Patient Instructions (Signed)
[facility] at [facility] Discharge Instructions     Thank you for choosing [facility] at [facility] to provide your oncology and hematology care.  To afford each patient quality time with our provider, please arrive at least 15 minutes before your scheduled appointment time.   If you have a lab appointment with the Cancer Center please come in thru the  Main Entrance and check in at the main information desk  You need to re-schedule your appointment should you arrive 10 or more minutes late.  We strive to give you quality time with our providers, and arriving late affects you and other patients whose appointments are after yours.  Also, if you no show three or more times for appointments you may be dismissed from the clinic at the providers discretion.     Again, thank you for choosing [facility].  Our hope is that these requests will decrease the amount of time that you wait before being seen by our physicians.       _____________________________________________________________  Should you have questions after your visit to [facility], please contact our office at [phone] between the hours of 8:00 a.m. and 4:30 p.m.  Voicemails left after 4:00 p.m. will not be returned until the following business day.  For prescription refill requests, have your pharmacy contact our office and allow 72 hours.    Cancer Center Support Programs:   > Cancer Support Group  2nd Tuesday of the month 1pm-2pm, Journey Room    

## 2018-08-18 NOTE — Assessment & Plan Note (Addendum)
1.  Extensive stage small cell lung cancer, with liver and bone mets: -He is being followed by Dr. Annamaria Boots at the [facility] pulmonary clinic for COPD.  He was a ex-heavy smoker.  CT scan of the chest done on 06/15/2018 at [facility] in [place] showed mediastinal adenopathy. - PET/CT scan on 07/30/2018 shows innumerable markedly hypermetabolic bone metastasis associated with hypermetabolic metastatic lymph nodes in the left supraclavicular area, mediastinum and left hilum.  Hypermetabolic liver lesions are present.  There is a tiny hypermetabolic nodule in the left retroperitoneal fat adjacent to the left kidney.  2 hypermetabolic nodules are evident in the left lung. - Fine-needle aspiration of the left supraclavicular lymph node on 08/12/2018 shows metastatic small cell carcinoma consistent with lung primary. - Reports 26 pound weight loss in the last 1 month.  Smoking history of 50 years.  Uses home oxygen and sleeps with CPAP at night. - He is able to barely walk at home.  He has a walker which he does not use.  He is able to get around the house without any major problems. - He complains of upper back pain for the last 1 month.  He is currently taking tramadol 50 mg every 8 hours as needed.  He has mild constipation from it.  Otherwise pain is reasonably well controlled. -We had a prolonged discussion about the scan findings and his diagnosis.  We talked about the normal prognosis of extensive stage small cell lung cancer with survival measured in months.  We talked about treatment options including best supportive care in the form of hospice versus palliative chemotherapy to control symptoms and prolong survival. -Patient opted to go with palliative chemotherapy.  I talked about the regimen containing carboplatin, etoposide and Atezolizumab given once every 3 weeks for 2-3 cycles followed by scanning to see response.  We also talked about the side effects in detail.  Because of his advanced age, he will  receive Neulasta support. - We will also give him allopurinol for the first 10 to 15 days. -I have called Dr. Theodosia Blender in [place] for port placement.  He kindly agreed to place the port tomorrow. -I have recommended doing MRI of the brain to complete the work-up process.  I have also recommended doing MRI of the thoracic spine to rule out any spinal cord compression.  He has generalized weakness but does not have any paraplegia.  His bowel and bladder function is intact. -We plan to start his chemotherapy next Monday.  2.  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 -Pathology showed papillary urothelial carcinoma in the right renal pelvis.  This was low-grade.  PTA pNX.  3.  Diabetes: -he reports that his metformin dose is 500 mg twice daily.  He is also taking glipizide 5 mg with breakfast. -He reports that his blood sugars have been low in the 60s lately.  He is not eating much by mouth. -I have recommended him to discontinue glipizide.

## 2018-08-18 NOTE — Progress Notes (Signed)
[place], [place] [address]   CLINIC:  Medical Oncology/Hematology  PCP:  Olena Mater, MD [address] [address] [phone]   REASON FOR VISIT: New diagnosis of small cell lung cancer   CURRENT THERAPY: work-up   INTERVAL HISTORY:  Gerald [name] 72 y.o. male returns for consultation of newly diagnosis of small cell lung cancer. He is here with his wife and son. He lives at home now with his wife and performs all his own ADLs. He is not very active and does most activities sitting due to his SOB. He has home oxygen as needed. He sleeps with a CPAP machine at night for his OSA. 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 He did work in the [employer] for 5-6 years. He rarely had direct contact with the shingles himself. He smoked cigarettes for 50 years. He started at age 63 and stopped 5 years ago. He has had a 26 pound unintentional weight loss in the past 1 month. His functional status has declined further in the past 3 weeks. He has constant pain in his spine from a stress fracture and pain in his shoulder and ribs. He takes tramadol as needed which helps with his pain at this time. He has a family history of a brother and sister both having lung cancer.   REVIEW OF SYSTEMS:  Review of Systems  Constitutional: Positive for fatigue.  Respiratory: Positive for shortness of breath.   Cardiovascular: Positive for leg swelling.  Gastrointestinal: Positive for constipation.  Neurological: Positive for extremity weakness.  All other systems reviewed and are negative.    PAST MEDICAL/SURGICAL HISTORY:  Past Medical History:  Diagnosis Date  . Bladder cancer (HCC)    BCG treatments  . CAD (coronary artery disease)   . COPD (chronic obstructive pulmonary disease) ([place])   . Heart attack ([place] [place])   . Kidney disease    Past Surgical History:  Procedure Laterality Date  . CORONARY STENT PLACEMENT     . KIDNEY SURGERY  06-2009   right nephrectomy 2009 for bladder Ca     SOCIAL HISTORY:  Social History   Socioeconomic History  . Marital status: Married    Spouse name: Not on file  . Number of children: Not on file  . Years of education: Not on file  . Highest education level: Not on file  Occupational History  . Not on file  Social Needs  . Financial resource strain: Not hard at all  . Food insecurity:    Worry: Never true    Inability: Never true  . Transportation needs:    Medical: No    Non-medical: No  Tobacco Use  . Smoking status: Former Smoker    Packs/day: 1.00    Years: 50.00    Pack years: 50.00    Types: Cigarettes    Last attempt to quit: 09/14/2008    Years since quitting: 9.9  . Smokeless tobacco: Never Used  Substance and Sexual Activity  . Alcohol use: No  . Drug use: No  . Sexual activity: Not on file  Lifestyle  . Physical activity:    Days per week: 0 days    Minutes per session: 0 min  . Stress: Only a little  Relationships  . Social connections:    Talks on phone: More than three times a week    Gets together: More than three times a week  Attends religious service: Not on file    Active member of club or organization: Not on file    Attends meetings of clubs or organizations: Not on file    Relationship status: Married  . Intimate partner violence:    Fear of current or ex partner: No    Emotionally abused: No    Physically abused: No    Forced sexual activity: No  Other Topics Concern  . Not on file  Social History Narrative  . Not on file    FAMILY HISTORY:  Family History  Problem Relation Age of Onset  . Heart attack Mother 24       maternal side of family history    CURRENT MEDICATIONS:  Outpatient Encounter Medications as of 08/18/2018  Medication Sig Note  . albuterol (PROVENTIL HFA;VENTOLIN HFA) 108 (90 Base) MCG/ACT inhaler Inhale into the lungs.   [name] albuterol (PROVENTIL) (2.5 MG/3ML) 0.083% nebulizer solution  Inhale into the lungs.   [name] guaiFENesin-codeine 100-10 MG/5ML syrup Take by mouth.   . levofloxacin (LEVAQUIN) 750 MG tablet Take by mouth.   [name] alendronate (FOSAMAX) 70 MG tablet Take 70 mg by mouth every 7 (seven) days.    Jearl Klinefelter ELLIPTA 62.5-25 MCG/INH AEPB USE 1 INHALATION DAILY (Patient taking differently: Inhale 1 puff into the lungs daily. )   . aspirin 81 MG tablet Take 81 mg by mouth daily.    [name] atorvastatin (LIPITOR) 80 MG tablet Take 80 mg by mouth daily.     . budesonide (PULMICORT) 0.5 MG/2ML nebulizer solution Take 2 mLs (0.5 mg total) by nebulization 2 (two) times daily.   . Calcium Carbonate-Vitamin D (CALCIUM 600+D) 600-400 MG-UNIT per tablet Take 1 tablet by mouth daily.     [name] glipiZIDE (GLUCOTROL) 5 MG tablet Take 5 mg by mouth daily before breakfast.   . Ipratropium-Albuterol (COMBIVENT RESPIMAT) 20-100 MCG/ACT AERS respimat USE 1 INHALATION EVERY 6 HOURS AS NEEDED FOR WHEEZING OR SHORTNESS OF BREATH   . losartan (COZAAR) 25 MG tablet Take 1 tablet by mouth daily.   . metFORMIN (GLUCOPHAGE) 500 MG tablet Take 500 mg by mouth 2 (two) times daily.    . metoprolol tartrate (LOPRESSOR) 25 MG tablet Take 1 tablet (25 mg total) by mouth 2 (two) times daily.   [name] omeprazole (PRILOSEC) 20 MG capsule Take 20 mg by mouth daily.    . predniSONE (DELTASONE) 10 MG tablet TAKE 6 TABS DAILY X 3 DAYS, 4 TABS DAILY X 3DAYS, 2 TABS DAILY X 3 DAYS   . Respiratory Therapy Supplies (FLUTTER) DEVI Blow through 4 times per set, 3 sets per day   . traMADol (ULTRAM) 50 MG tablet Take 1 tablet (50 mg total) by mouth every 6 (six) hours as needed.   . traZODone (DESYREL) 100 MG tablet Take 50 mg by mouth at bedtime.  06/14/2015: Received from: External Pharmacy  . VENTOLIN HFA 108 (90 Base) MCG/ACT inhaler     No facility-administered encounter medications on file as of 08/18/2018.     ALLERGIES:  No Known Allergies   PHYSICAL EXAM:  ECOG Performance status: 1  Vitals:   08/18/18 1328  BP: (!)  109/56  Pulse: (!) 125  Resp: (!) 22  Temp: 98.5 F (36.9 C)  SpO2: 98%   Filed Weights   08/18/18 1328  Weight: 147 lb 3.2 oz (66.8 kg)    Physical Exam  Constitutional: He is oriented to person, place, and time. He appears well-developed and well-nourished.  Neck: Normal  range of motion. Neck supple.  Cardiovascular: Normal rate, regular rhythm and normal heart sounds.  Pulmonary/Chest: Effort normal and breath sounds normal.  Abdominal: Soft.  Musculoskeletal: Normal range of motion.  Neurological: He is alert and oriented to person, place, and time.  Skin: Skin is warm and dry.  Psychiatric: He has a normal mood and affect. His behavior is normal. Judgment and thought content normal.  Extremities: 2+ edema bilaterally.   LABORATORY DATA:  I have reviewed the labs as listed.  CBC    Component Value Date/Time   WBC 13.1 (H) 08/18/2018 1507   RBC 3.95 (L) 08/18/2018 1507   HGB 12.8 (L) 08/18/2018 1507   HCT 40.4 08/18/2018 1507   PLT 155 08/18/2018 1507   MCV 102.3 (H) 08/18/2018 1507   MCH 32.4 08/18/2018 1507   MCHC 31.7 08/18/2018 1507   RDW 17.6 (H) 08/18/2018 1507   LYMPHSABS 0.5 (L) 08/18/2018 1507   MONOABS 0.9 08/18/2018 1507   EOSABS 0.0 08/18/2018 1507   BASOSABS 0.1 08/18/2018 1507   CMP Latest Ref Rng & Units 08/18/2018 08/18/2018 06/22/2018  Glucose 70 - 99 mg/dL - 156(H) 150(H)  BUN 8 - 23 mg/dL - 39(H) 19  Creatinine 0.61 - 1.24 mg/dL 1.40(H) 1.66(H) 1.13  Sodium 135 - 145 mmol/L - 135 135  Potassium 3.5 - 5.1 mmol/L - 5.0 4.4  Chloride 98 - 111 mmol/L - 101 96  CO2 22 - 32 mmol/L - 11(L) 31  Calcium 8.9 - 10.3 mg/dL - 9.6 9.2  Total Protein 6.5 - 8.1 g/dL - 7.6 -  Total Bilirubin 0.3 - 1.2 mg/dL - 1.8(H) -  Alkaline Phos 38 - 126 U/L - 250(H) -  AST 15 - 41 U/L - 108(H) -  ALT 0 - 44 U/L - PENDING -       DIAGNOSTIC IMAGING:  I have independently reviewed the scans and discussed with the patient.  I have reviewed Francene Finders, NP's note  and agree with the documentation.  I personally performed a face-to-face visit, made revisions and my assessment and plan is as follows.     ASSESSMENT & PLAN:   Small cell carcinoma ([place]) 1.  Extensive stage small cell lung cancer, with liver and bone mets: -He is being followed by Dr. Annamaria Boots at the [facility] pulmonary clinic for COPD.  He was a ex-heavy smoker.  CT scan of the chest done on 06/15/2018 at [facility] in [place] showed mediastinal adenopathy. - PET/CT scan on 07/30/2018 shows innumerable markedly hypermetabolic bone metastasis associated with hypermetabolic metastatic lymph nodes in the left supraclavicular area, mediastinum and left hilum.  Hypermetabolic liver lesions are present.  There is a tiny hypermetabolic nodule in the left retroperitoneal fat adjacent to the left kidney.  2 hypermetabolic nodules are evident in the left lung. - Fine-needle aspiration of the left supraclavicular lymph node on 08/12/2018 shows metastatic small cell carcinoma consistent with lung primary. - Reports 26 pound weight loss in the last 1 month.  Smoking history of 50 years.  Uses home oxygen and sleeps with CPAP at night. - He is able to barely walk at home.  He has a walker which he does not use.  He is able to get around the house without any major problems. - He complains of upper back pain for the last 1 month.  He is currently taking tramadol 50 mg every 8 hours as needed.  He has mild constipation from it.  Otherwise pain is reasonably well controlled. -  We had a prolonged discussion about the scan findings and his diagnosis.  We talked about the normal prognosis of extensive stage small cell lung cancer with survival measured in months.  We talked about treatment options including best supportive care in the form of hospice versus palliative chemotherapy to control symptoms and prolong survival. -Patient opted to go with palliative chemotherapy.  I talked about the regimen containing  carboplatin, etoposide and Atezolizumab given once every 3 weeks for 2-3 cycles followed by scanning to see response.  We also talked about the side effects in detail.  Because of his advanced age, he will receive Neulasta support. - We will also give him allopurinol for the first 10 to 15 days. -I have called Dr. Theodosia Blender in [place] for port placement.  He kindly agreed to place the port tomorrow. -I have recommended doing MRI of the brain to complete the work-up process.  I have also recommended doing MRI of the thoracic spine to rule out any spinal cord compression.  He has generalized weakness but does not have any paraplegia.  His bowel and bladder function is intact. -We plan to start his chemotherapy next Monday.  2.  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 -Pathology showed papillary urothelial carcinoma in the right renal pelvis.  This was low-grade.  PTA pNX.  3.  Diabetes: -he reports that his metformin dose is 500 mg twice daily.  He is also taking glipizide 5 mg with breakfast. -He reports that his blood sugars have been low in the 60s lately.  He is not eating much by mouth. -I have recommended him to discontinue glipizide.      Orders placed this encounter:  Orders Placed This Encounter  Procedures  . MR Brain W Wo Contrast  . MR Thoracic Spine W Wo Contrast  . CBC with Differential/Platelet  . Comprehensive metabolic panel  . TSH      Gerald [name], [place] [phone]

## 2018-08-18 NOTE — Progress Notes (Signed)
START ON PATHWAY REGIMEN - Small Cell Lung     Cycles 1 through 4, every 21 days:     Atezolizumab      Carboplatin      Etoposide    Cycles 5 and beyond, every 21 days:     Atezolizumab   **Always confirm dose/schedule in your pharmacy ordering system**  Patient Characteristics: Newly Diagnosed, Preoperative or Nonsurgical Candidate (Clinical Staging), First Line, Extensive Stage Therapeutic Status: Newly Diagnosed, Preoperative or Nonsurgical Candidate (Clinical Staging) AJCC T Category: cTX AJCC N Category: cNX AJCC M Category: pM1c AJCC 8 Stage Grouping: IVB Stage Classification: Extensive Intent of Therapy: Non-Curative / Palliative Intent, Discussed with Patient

## 2018-08-19 ENCOUNTER — Telehealth (HOSPITAL_COMMUNITY): Payer: Self-pay

## 2018-08-19 MED ORDER — LIDOCAINE-PRILOCAINE 2.5-2.5 % EX CREA: TOPICAL_CREAM | CUTANEOUS | 3 refills | Status: AC

## 2018-08-19 MED ORDER — PROCHLORPERAZINE MALEATE 10 MG PO TABS: 10.0000 mg | ORAL_TABLET | Freq: Four times a day (QID) | ORAL | 1 refills | Status: AC | PRN

## 2018-08-19 NOTE — Telephone Encounter (Signed)
Nutrition Assessment:   Reason for assessment: Patient identified on Malnutrition Screening tool for poor appetite and weight loss  72 year old male with new diagnosis of small cell lung cancer with liver and bone mets.  Past medical history of smoking, COPD, CAD, GERD, DM, HLD.  Noted planning palliative chemotherapy.    Called and spoke with wife as she reports patient is sleeping from port being placed this am.  Wife reports appetite is terrible.  "He tells me he wants to eat at 6pm and I have it ready then he doesn't want it." "He just does not want to eat."  Denies nausea or issues with pain keeping patient from eating.  Reports drank boost plus here at cancer center yesterday.   Medications: reviewed  Labs: reviewed  Anthropometrics:   Height: 68 inches Weight: 147 lb Noted weight os 176 lb on 06/06/18 BMI: 22  16% weight loss in the last 2 1/2 months   NUTRITION DIAGNOSIS: Inadequate oral intake related to cancer, poor appetite as evidenced by 16% weight loss and poor appetite   INTERVENTION:  Encouraged 350 calorie or oral nutrition shakes. Discussed things to add to shake to increase calories Discussed briefly strategies to increase calories and protein.  Offered to mail information and wife declined. Offered nutrition appointment and to give contact information and wife declined at this time.     MONITORING, EVALUATION, GOAL: weight trends, intake   NEXT VISIT: wife to contact RD if needed  Gerald [name], [place], [place] Registered Dietitian [phone] (pager)

## 2018-08-21 NOTE — Progress Notes (Signed)
Chemotherapy teaching pulled together. 

## 2018-08-21 NOTE — Patient Instructions (Signed)
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 We are going to be treating you with palliative intent. This means that your cancer is not curable but it is treatable.  We are going to be treating you with chemotherapy every 21 days (three days each treatment).  You will get Carboplatin (platinol), Vepesid (VP-16, Etoposide) and Tecentriq (Atezolizumab).  You will get all 3 treatments on day 1 and then you will get the vepesid only on days 2 and 3.  You will get this every 21 days through your port a cath.  You will see the doctor regularly throughout treatment.  We monitor your lab work prior to every treatment. The doctor monitors your response to treatment by the way you are feeling, your blood work, and scans periodically.  There will be wait times while you are here for treatment.  It will take about 30 minutes to 1 hour for your lab work to result.  Then there will be wait times while pharmacy mixes your medications.   You will get the following premedications prior to each treatment: Aloxi- high powered antiemetic (nausea/vomiting) to prevent chemotherapy induced nausea/vomting Emend - high powered antiemetic (nausea/vomiting) to prevent chemotherapy induced nausea/vomting  Atezolizumab (Tecentriq)  About This Drug Huey Bienenstock is used to treat cancer. It is given by the vein (IV).  Possible Side Effects . Nausea . Tiredness and weakness . Decreased appetite (decreased hunger) . Cough . Trouble breathing  Note: Each of the side effects above was reported in 20% or greater of patients treated with atezolizumab. Your side effects may be different if you are taking atezolizumab in combination with another agent. Not all possible side effects are included above.  Warnings and Precautions . This drug works with your immune system and can cause inflammation (swelling) in any of your organs and tissues and can change  how they work. This may put you at risk for developing serious medical problems, which can be life-threatening. . Inflammation of the lungs which can be life-threatening. You may have a dry cough or trouble breathing. . Severe changes in your liver function which can cause liver failure and be life-threatening. . Colitis which is swelling in the colon. The symptoms are diarrhea, stomach cramping, and sometimes blood in the bowel movements. . Changes in your central nervous system can happen. The central nervous system is made up of your brain and spinal cord. You could feel extreme tiredness, agitation, confusion, hallucinations (see or hear things that are not there), trouble understanding or speaking, loss of control of your bowels or bladder, eyesight changes, numbness or lack of strength to your arms, legs, face, or body, and coma. If you start to have any of these symptoms let your doctor know right away. . This drug may affect your hormone glands (thyroid, adrenals, pituitary and pancreas). . Blood sugar levels may change, and you may develop diabetes. If you already have diabetes, changes may need to be made to your diabetes medication. . Severe infections, including viral, bacterial and fungal, which can be life-threatening . While you are getting this drug in your vein (IV), you may have a reaction to the drug. Sometimes you may be given medication to stop or lessen these side effects. Your nurse will check you closely for these signs: fever or shaking chills, flushing, facial swelling, feeling dizzy, headache, trouble breathing, rash, itching, chest tightness, or chest pain. These reactions may happen after your infusion. If  this happens, call 911 for emergency care.  Note: Some of the side effects above are very rare. If you have concerns and/or questions, please discuss them with your medical team.  Important Information . This drug may be present in the saliva, tears, sweat,  urine, stool, vomit, semen, and vaginal secretions. Talk to your doctor and/or your nurse about the necessary precautions to take during this time.  Treating Side Effects . Manage tiredness by pacing your activities for the day. . Be sure to include periods of rest between energy-draining activities. . To decrease the risk of infection, wash your hands regularly. . Avoid close contact with people who have a cold, the flu, or other infections. . Take your temperature as your doctor or nurse tells you, and whenever you feel like you may have a fever. . Drink plenty of fluids (a minimum of eight glasses per day is recommended). . Ask your doctor or nurse about medicine that is available to help stop or lessen loose bowel movements. . To help with nausea, eat small, frequent meals instead of three large meals a day. Choose foods and drinks that are at room temperature. Ask your nurse or doctor about other helpful tips and medicine that is available to help stop or lessen these symptoms. . To help with decreased appetite, eat small, frequent meals. Eat foods high in calories and protein, such as meat, poultry, fish, dry beans, tofu, eggs, nuts, milk, yogurt, cheese, ice cream, pudding, and nutritional supplements. . Consider using sauces and spices to increase taste. Daily exercise, with your doctor's approval, may increase your appetite. . If you have diabetes, keep good control of your blood sugar level. Tell your nurse or your doctor if your glucose levels are higher or lower than normal. . Keeping your pain under control is important to your well-being. Please tell your doctor or nurse if you are experiencing pain. . If you get a rash do not put anything on it unless your doctor or nurse says you may. Keep the area around the rash clean and dry. Ask your doctor for medicine if your rash bothers you. . If you have numbness and tingling in your hands and feet, be careful when cooking,  walking, and handling sharp objects and hot liquids. . Infusion reactions may happen after your infusion. If this happens, call 911 for emergency care.  Food and Drug Interactions . There are no known interactions of atezolizumab with food. . This drug may interact with other medicines. Tell your doctor and pharmacist about all the prescription and over-the-counter medicines and dietary supplements (vitamins, minerals, herbs and others) that you are taking at this time. Also, check with your doctor or pharmacist before starting any new prescription or over-the-counter medicines, or dietary supplements to make sure that there are no interactions.  When to Call the Doctor Call your doctor or nurse if you have any of these symptoms and/or any new or unusual symptoms: . Fever of 100.4 F (38 C) or higher . Chills . Tiredness that interferes with your daily activities . Feeling dizzy or lightheaded . Pain in your chest . Dry cough . Coughing up yellow, green, or bloody mucus . Wheezing or trouble breathing . Feeling that your heart is beating in a fast or not normal way (palpitations) . Confusion and/or agitation . Hallucinations . Trouble understanding or speaking . Numbness or lack of strength to your arms, legs, face, or body . Blurred vision or other changes in eyesight . Diarrhea,  4 times in one day or diarrhea with lack of strength or a feeling of being dizzy . Pain in your abdomen that does not go away . Blood in your stool . Nausea that stops you from eating or drinking and/or is not relieved by prescribed medicines . Throwing up more than 3 times a day . Lasting loss of appetite or rapid weight loss of five pounds in a week . Abnormal blood sugar . Unusual thirst, passing urine often, headache, sweating, shakiness, irritability . Pain that does not go away, or is not relieved by prescribed medicines . Numbness, tingling, or pain your hands and feet . Extreme weakness that  interferes with normal activities . A new rash or a rash that is not relieved by prescribed medicines . Signs of infusion reaction: fever or shaking chills, flushing, facial swelling, feeling dizzy, headache, trouble breathing, rash, itching, chest tightness, or chest pain. If this happens, call 911 for emergency care. . Signs of possible liver problems: dark urine, pale bowel movements, bad stomach pain, feeling very tired and weak, unusual itching, or yellowing of the eyes or skin . If you think you may be pregnant  Reproduction Warnings . Pregnancy warning: This drug can have harmful effects on the unborn baby. Women of childbearing potential should use effective methods of birth control during your cancer treatment and for at least 5 months after treatment. Let your doctor know right away if you think you may be pregnant. . Breastfeeding warning: It is not known if this drug passes into breast milk. For this reason, Women should not breastfeed during treatment and for at least 5 months after treatment because this drug could enter the breast milk and cause harm to a breastfeeding baby. . Fertility warning: In women, this drug may affect your ability to have children in the future. Talk with your doctor or nurse if you plan to have children. Ask for information on egg banking.  Carboplatin (Paraplatin, CBDCA)  About This Drug Carboplatin is used to treat cancer. It is given in the vein (IV).  Possible Side Effects . Bone marrow suppression. This is a decrease in the number of white blood cells, red blood cells, and platelets. This may raise your risk of infection, make you tired and weak (fatigue), and raise your risk of bleeding. . Nausea and vomiting (throwing up) . Weakness . Changes in your liver function . Changes in your kidney function . Electrolyte changes . Pain  Note: Each of the side effects above was reported in 20% or greater of patients treated with  carboplatin. Not all possible side effects are included above.  Warnings and Precautions . Severe bone marrow suppression . Allergic reactions, including anaphylaxis are rare but may happen in some patients. Signs of allergic reaction to this drug may be swelling of the face, feeling like your tongue or throat are swelling, trouble breathing, rash, itching, fever, chills, feeling dizzy, and/or feeling that your heart is beating in a fast or not normal way. If this happens, do not take another dose of this drug. You should get urgent medical treatment. . Severe nausea and vomiting . Effects on the nerves are called peripheral neuropathy. This risk is increased if you are over the age of 74 or if you have received other medicine with risk of peripheral neuropathy. You may feel numbness, tingling, or pain in your hands and feet. It may be hard for you to button your clothes, open jars, or walk as usual. The effect  on the nerves may get worse with more doses of the drug. These effects get better in some people after the drug is stopped but it does not get better in all people. [name] Blurred vision, loss of vision or other changes in eyesight . Decreased hearing . Skin and tissue irritation including redness, pain, warmth, or swelling at the IV site if the drug leaks out of the vein and into nearby tissue. . Severe changes in your kidney function, which can cause kidney failure . Severe changes in your liver function, which can cause liver failure  Note: Some of the side effects above are very rare. If you have concerns and/or questions, please discuss them with your medical team.  Important Information . This drug may be present in the saliva, tears, sweat, urine, stool, vomit, semen, and vaginal secretions. Talk to your doctor and/or your nurse about the necessary precautions to take during this time.  Treating Side Effects . Manage tiredness by pacing your activities for the day. . Be  sure to include periods of rest between energy-draining activities. . To decrease the risk of infection, wash your hands regularly. . Avoid close contact with people who have a cold, the flu, or other infections. . Take your temperature as your doctor or nurse tells you, and whenever you feel like you may have a fever. . To help decrease the risk of bleeding, use a soft toothbrush. Check with your nurse before using dental floss. . Be very careful when using knives or tools. . Use an electric shaver instead of a razor. . Drink plenty of fluids (a minimum of eight glasses per day is recommended). . If you throw up or have loose bowel movements, you should drink more fluids so that you do not become dehydrated (lack of water in the body from losing too much fluid). . To help with nausea and vomiting, eat small, frequent meals instead of three large meals a day. Choose foods and drinks that are at room temperature. Ask your nurse or doctor about other helpful tips and medicine that is available to help stop or lessen these symptoms. . If you have numbness and tingling in your hands and feet, be careful when cooking, walking, and handling sharp objects and hot liquids. [name] Keeping your pain under control is important to your well-being. Please tell your doctor or nurse if you are experiencing pain.  Food and Drug Interactions . There are no known interactions of carboplatin with food. . This drug may interact with other medicines. Tell your doctor and pharmacist about all the prescription and over-the-counter medicines and dietary supplements (vitamins, minerals, herbs and others) that you are taking at this time. Also, check with your doctor or pharmacist before starting any new prescription or over-the-counter medicines, or dietary supplements to make sure that there are no interactions.  When to Call the Doctor Call your doctor or nurse if you have any of these symptoms and/or any new or  unusual symptoms: . Fever of 100.4 F (38 C) or higher . Chills . Tiredness that interferes with your daily activities . Feeling dizzy or lightheaded . Easy bleeding or bruising . Nausea that stops you from eating or drinking and/or is not relieved by prescribed medicines . Throwing up more than 3 times a day . Blurred vision or other changes in eyesight . Decrease in hearing or ringing in the ear . Signs of allergic reaction: swelling of the face, feeling like your tongue or throat are swelling,  trouble breathing, rash, itching, fever, chills, feeling dizzy, and/or feeling that your heart is beating in a fast or not normal way. If this happens, call 911 for emergency care. . While you are getting this drug, please tell your nurse right away if you have any pain, redness, or swelling at the site of the IV infusion . Signs of possible liver problems: dark urine, pale bowel movements, bad stomach pain, feeling very tired and weak, unusual itching, or yellowing of the eyes or skin . Decreased urine, or very dark urine . Numbness, tingling, or pain in your hands and feet . Pain that does not go away or is not relieved by prescribed medicine . If you think you may be pregnant  Reproduction Warnings . Pregnancy warning: This drug may have harmful effects on the unborn baby. Women of child bearing potential should use effective methods of birth control during your cancer treatment. Let your doctor know right away if you think you may be pregnant. . Breastfeeding warning: It is not known if this drug passes into breast milk. For this reason, women should not breastfeed during treatment because this drug could enter the breast milk and cause harm to a breastfeeding baby. . Fertility warning: Human fertility studies have not been done with this drug. Talk with your doctor or nurse if you plan to have children. Ask for information on sperm or egg banking.  Etoposide (Toposar, VePesid,  VP-16)  About This Drug Etoposide is used to treat cancer. It is given in the vein (IV) and orally (by mouth).  Possible Side Effects . Bone marrow suppression. This is a decrease in the number of white blood cells, red blood cells, and platelets. This may raise your risk of infection, make you tired and weak (fatigue), and raise your risk of bleeding. . Nausea and vomiting (throwing up) . Hair loss. Hair loss is often temporary, although with certain medicine, hair loss can sometimes be permanent. Hair loss may happen suddenly or gradually. If you lose hair, you may lose it from your head, face, armpits, pubic area, chest, and/or legs. You may also notice your hair getting thin.  Note: Each of the side effects above was reported in 20% or greater of patients treated with etoposide. Not all possible side effects are included above.  Warnings and Precautions . Severe bone marrow suppression, which may be life-threatening . This drug may raise your risk of getting a second cancer, such as leukemia . Low blood pressure with rapid infusion of the medication . Allergic reactions, including anaphylaxis are rare but may happen in some patients. Signs of allergic reaction to this drug may be swelling of the face, feeling like your tongue or throat are swelling, trouble breathing, rash, itching, fever, chills, feeling dizzy, and/or feeling that your heart is beating in a fast or not normal way. If this happens, do not take another dose of this drug. You should get urgent medical treatment. . These side effects may be more severe if you are receiving high doses of this medication included in pre-transplant chemotherapy.  How to Take Your Medication . For Oral: Swallow the medicine as prescribed by your doctor. . Missed dose: If you vomit or miss a dose, contact your doctor for further instructions. Do not take 2 doses at the same time. . Handling: Wash your hands after handling your medicine,  your caretakers should not handle your medicine with bare hands and should wear latex gloves. . If any of the capsules  are broken, do not touch them with bare hands. Carefully throw away the capsules and wash your hands after handling. . If you get any of the content of a broken capsules on your skin or in your eyes, you should wash the area of the skin well with soap and water right away. Call your doctor if you get a skin reaction. . This drug may be present in the saliva, tears, sweat, urine, stool, vomit, semen, and vaginal secretions. Talk to your doctor and/or your nurse about the necessary precautions to take during this time. . Storage: Store this medicine in the refrigerator, between 15F to 49F (2C to [place]). Do not freeze. [name] Disposal of unused medicine: Do not flush any expired and/or unused medicine down the toilet or drain unless you are specifically instructed to do so on the medication label. Some facilities have take-back programs and/or other options. If you do not have a take-back program in your area, then please discuss with your nurse or your doctor how to dispose of unused medicine.  Treating Side Effects . Manage tiredness by pacing your activities for the day. . Be sure to include periods of rest between energy-draining activities. . To decrease the risk of infection, wash your hands regularly. . Avoid close contact with people who have a cold, the flu, or other infections. . Take your temperature as your doctor or nurse tells you, and whenever you feel like you may have a fever. . To help decrease bleeding, use a soft toothbrush. Check with your nurse before using dental floss. . Be very careful when using knives or tools. . Use an electric shaver instead of a razor. . Drink plenty of fluids (a minimum of eight glasses per day is recommended). . If you throw up or have loose bowel movements, you should drink more fluids so that you do not become dehydrated (lack of  water in the body from losing too much fluid). . To help with nausea and vomiting, eat small, frequent meals instead of three large meals a day. Choose foods and drinks that are at room temperature. Ask your nurse or doctor about other helpful tips and medicine that is available to help or stop lessen these symptoms. . To help with hair loss, wash with a mild shampoo and avoid washing your hair every day. . Avoid rubbing your scalp, pat your hair or scalp dry. . Avoid coloring your hair. . Limit your use of hair spray, electric curlers, blow dryers, and curling irons. . If you are interested in getting a wig, talk to your nurse. You can also call the [facility] at [phone] to find out information about the "Look Good, Feel Better" program close to where you live. It is a free program where women getting chemotherapy can learn about wigs, turbans and scarves as well as makeup techniques and skin and nail care.  Food and Drug Interactions . There are no known interactions of etoposide with food. . This drug may interact with other medicines. Tell your doctor and pharmacist about all the medicines and dietary supplements (vitamins, minerals, herbs and others) that you are taking at this time. The safety and use of dietary supplements and alternative diets are often not known. Using these might affect your cancer or interfere with your treatment. Until more is known, you should not use dietary supplements or alternative diets without your cancer doctor's help. . There are known interactions of etoposide with blood thinning medicine such as warfarin. Ask  your doctor what precautions you should take.  When to Call the Doctor Call your doctor or nurse if you have any of these symptoms and/or any new or unusual symptoms: . Fever of 100.4 F (38 C) or higher . Chills . Tiredness that interferes with your daily activities . Feeling dizzy or lightheaded . Feeling that your heart  is beating in a fast or not normal way (palpitations) . Easy bleeding or bruising . Nausea that stops you from eating or drinking and/or is not relieved by prescribed medicines . Throwing up more than 3 times a day . Signs of allergic reaction: swelling of the face, feeling like your tongue or throat are swelling, trouble breathing, rash, itching, fever, chills, feeling dizzy, and/or feeling that your heart is beating in a fast or not normal way . If you think you may be pregnant or may have impregnated your partner  Reproduction Warnings . Pregnancy warning: This drug can have harmful effects on the unborn baby. Women of childbearing potential should use effective methods of birth control during your cancer treatment and for at least 6 months after treatment. Men with male partners of childbearing potential should use effective methods of birth control during your cancer treatment and for at least 4 months after your cancer treatment. Let your doctor know right away if you think you may be pregnant or may have impregnated your partner. . Breastfeeding warning: It is not known if this drug passes into breast milk. For this reason, women should talk to their doctor about the risks and benefits of breastfeeding during treatment with this drug because this drug may enter the breast milk and cause harm to a breastfeeding baby. . Fertility warning: In men and women both, this drug may affect your ability to have children in the future. Talk with your doctor or nurse if you plan to have children. Ask for information on sperm or egg banking.  SELF CARE ACTIVITIES WHILE ON CHEMOTHERAPY:  Hydration Increase your fluid intake 48 hours prior to treatment and drink at least 8 to 12 cups (64 ounces) of water/decaffeinated beverages per day after treatment. You can still have your cup of coffee or soda but these beverages do not count as part of your 8 to 12 cups that you need to drink daily. No  alcohol intake.  Medications Continue taking your normal prescription medication as prescribed.  If you start any new herbal or new supplements please let us know first to make sure it is safe.  Mouth Care Have teeth cleaned professionally before starting treatment. Keep dentures and partial plates clean. Use soft toothbrush and do not use mouthwashes that contain alcohol. Biotene is a good mouthwash that is available at most pharmacies or may be ordered by calling [phone]. Use warm salt water gargles (1 teaspoon salt per 1 quart warm water) before and after meals and at bedtime. Or you may rinse with 2 tablespoons of three-percent hydrogen peroxide mixed in eight ounces of water. If you are still having problems with your mouth or sores in your mouth please call the clinic. If you need dental work, please let the doctor know before you go for your appointment so that we can coordinate the best possible time for you in regards to your chemo regimen. You need to also let your dentist know that you are actively taking chemo. We may need to do labs prior to your dental appointment.  Skin Care Always use sunscreen that has not expired and  with SPF Nancy Fetter Protection Factor) of 50 or higher. Wear hats to protect your head from the sun. Remember to use sunscreen on your hands, ears, face, & feet.  Use good moisturizing lotions such as udder cream, eucerin, or even Vaseline. Some chemotherapies can cause dry skin, color changes in your skin and nails.    . Avoid long, hot showers or baths. . Use gentle, fragrance-free soaps and laundry detergent. . Use moisturizers, preferably creams or ointments rather than lotions because the thicker consistency is better at preventing skin dehydration. Apply the cream or ointment within 15 minutes of showering. Reapply moisturizer at night, and moisturize your hands every time after you wash them.  Hair Loss (if your doctor says your hair will fall out)  . If your  doctor says that your hair is likely to fall out, decide before you begin chemo whether you want to wear a wig. You may want to shop before treatment to match your hair color. . Hats, turbans, and scarves can also camouflage hair loss, although some people prefer to leave their heads uncovered. If you go bare-headed outdoors, be sure to use sunscreen on your scalp. . Cut your hair short. It eases the inconvenience of shedding lots of hair, but it also can reduce the emotional impact of watching your hair fall out. . Don't perm or color your hair during chemotherapy. Those chemical treatments are already damaging to hair and can enhance hair loss. Once your chemo treatments are done and your hair has grown back, it's OK to resume dyeing or perming hair. With chemotherapy, hair loss is almost always temporary. But when it grows back, it may be a different color or texture. In older adults who still had hair color before chemotherapy, the new growth may be completely gray.  Often, new hair is very fine and soft.  Infection Prevention Please wash your hands for at least 30 seconds using warm soapy water. Handwashing is the #1 way to prevent the spread of germs. Stay away from sick people or people who are getting over a cold. If you develop respiratory systems such as green/yellow mucus production or productive cough or persistent cough let us know and we will see if you need an antibiotic. It is a good idea to keep a pair of gloves on when going into grocery stores/[employer] to decrease your risk of coming into contact with germs on the carts, etc. Carry alcohol hand gel with you at all times and use it frequently if out in public. If your temperature reaches 100.5 or higher please call the clinic and let us know.  If it is after hours or on the weekend please go to the ER if your temperature is over 100.5.  Please have your own personal thermometer at home to use.    Sex and bodily fluids If you are going to  have sex, a condom must be used to protect the person that isn't taking chemotherapy. Chemo can decrease your libido (sex drive). For a few days after chemotherapy, chemotherapy can be excreted through your bodily fluids.  When using the toilet please close the lid and flush the toilet twice.  Do this for a few day after you have had chemotherapy.   Effects of chemotherapy on your sex life Some changes are simple and won't last long. They won't affect your sex life permanently. Sometimes you may feel: . too tired . not strong enough to be very active . sick or sore  .  not in the mood . anxious or low Your anxiety might not seem related to sex. For example, you may be worried about the cancer and how your treatment is going. Or you may be worried about money, or about how you family are coping with your illness. These things can cause stress, which can affect your interest in sex. It's important to talk to your partner about how you feel. Remember - the changes to your sex life don't usually last long. There's usually no medical reason to stop having sex during chemo. The drugs won't have any long term physical effects on your performance or enjoyment of sex. Cancer can't be passed on to your partner during sex  Contraception It's important to use reliable contraception during treatment. Avoid getting pregnant while you or your partner are having chemotherapy. This is because the drugs may harm the baby. Sometimes chemotherapy drugs can leave a man or woman infertile.  This means you would not be able to have children in the future. You might want to talk to someone about permanent infertility. It can be very difficult to learn that you may no longer be able to have children. Some people find counselling helpful. There might be ways to preserve your fertility, although this is easier for men than for women. You may want to speak to a fertility expert. You can talk about sperm banking or harvesting  your eggs. You can also ask about other fertility options, such as donor eggs. If you have or have had breast cancer, your doctor might advise you not to take the contraceptive pill. This is because the hormones in it might affect the cancer.  It is not known for sure whether or not chemotherapy drugs can be passed on through semen or secretions from the vagina. Because of this some doctors advise people to use a barrier method if you have sex during treatment. This applies to vaginal, anal or oral sex. Generally, doctors advise a barrier method only for the time you are actually having the treatment and for about a week after your treatment. Advice like this can be worrying, but this does not mean that you have to avoid being intimate with your partner. You can still have close contact with your partner and continue to enjoy sex.  Animals If you have cats or birds we just ask that you not change the litter or change the cage.  Please have someone else do this for you while you are on chemotherapy.   Food Safety During and After Cancer Treatment Food safety is important for people both during and after cancer treatment. Cancer and cancer treatments, such as chemotherapy, radiation therapy, and stem cell/bone marrow transplantation, often weaken the immune system. This makes it harder for your body to protect itself from foodborne illness, also called food poisoning. Foodborne illness is caused by eating food that contains harmful bacteria, parasites, or viruses.  Foods to avoid Some foods have a higher risk of becoming tainted with bacteria. These include: [name] Unwashed fresh fruit and vegetables, especially leafy vegetables that can hide dirt and other contaminants . Raw sprouts, such as alfalfa sprouts . Raw or undercooked beef, especially ground beef, or other raw or undercooked meat and poultry . Fatty, fried, or spicy foods immediately before or after treatment.  These can sit heavy on your  stomach and make you feel nauseous. . Raw or undercooked shellfish, such as oysters. . Sushi and sashimi, which often contain raw fish.  . Unpasteurized  beverages, such as unpasteurized fruit juices, raw milk, raw yogurt, or cider . Undercooked eggs, such as soft boiled, over easy, and poached; raw, unpasteurized eggs; or foods made with raw egg, such as homemade raw cookie dough and homemade mayonnaise Simple steps for food safety Shop smart. . Do not buy food stored or displayed in an unclean area. . Do not buy bruised or damaged fruits or vegetables. . Do not buy cans that have cracks, dents, or bulges. . Pick up foods that can spoil at the end of your shopping trip and store them in a cooler on the way home. Prepare and clean up foods carefully. . Rinse all fresh fruits and vegetables under running water, and dry them with a clean towel or paper towel. . Clean the top of cans before opening them. . After preparing food, wash your hands for 20 seconds with hot water and soap. Pay special attention to areas between fingers and under nails. . Clean your utensils and dishes with hot water and soap. [name] Disinfect your kitchen and cutting boards using 1 teaspoon of liquid, unscented bleach mixed into 1 quart of water.   Dispose of old food. . Eat canned and packaged food before its expiration date (the "use by" or "best before" date). . Consume refrigerated leftovers within 3 to 4 days. After that time, throw out the food. Even if the food does not smell or look spoiled, it still may be unsafe. Some bacteria, such as Listeria, can grow even on foods stored in the refrigerator if they are kept for too long. Take precautions when eating out. . At restaurants, avoid buffets and salad bars where food sits out for a long time and comes in contact with many people. Food can become contaminated when someone with a virus, often a norovirus, or another "bug" handles it. . Put any leftover food in a "to-go"  container yourself, rather than having the server do it. And, refrigerate leftovers as soon as you get home. . Choose restaurants that are clean and that are willing to prepare your food as you order it cooked.   MEDICATIONS:                                                                                                                                                                Compazine/Prochlorperazine 10mg  tablet. Take 1 tablet every 6 hours as needed for nausea/vomiting. (This can make you sleepy)   EMLA cream. Apply a quarter size amount to port site 1 hour prior to chemo. Do not rub in. Cover with plastic wrap.   Over-the-Counter Meds:  Colace - 100 mg capsules - take 2 capsules daily.  If this doesn't help then you can increase to 2 capsules twice daily.  Call us if  this does not help your bowels move.   Imodium 2mg  capsule. Take 2 capsules after the 1st loose stool and then 1 capsule every 2 hours until you go a total of 12 hours without having a loose stool. Call the [facility] if loose stools continue. If diarrhea occurs at bedtime, take 2 capsules at bedtime. Then take 2 capsules every 4 hours until morning. Call [facility].    Diarrhea Sheet   If you are having loose stools/diarrhea, please purchase Imodium and begin taking as outlined:  At the first sign of poorly formed or loose stools you should begin taking Imodium (loperamide) 2 mg capsules.  Take two caplets (4mg ) followed by one caplet (2mg ) every 2 hours until you have had no diarrhea for 12 hours.  During the night take two caplets (4mg ) at bedtime and continue every 4 hours during the night until the morning.  Stop taking Imodium only after there is no sign of diarrhea for 12 hours.    Always call the [facility] if you are having loose stools/diarrhea that you can't get under control.  Loose stools/diarrhea leads to dehydration (loss of water) in your body.  We have other options of trying to get the  loose stools/diarrhea to stop but you must let us know!   Constipation Sheet  Colace - 100 mg capsules - take 2 capsules daily.  If this doesn't help then you can increase to 2 capsules twice daily.  Please call if the above does not work for you.   Do not go more than 2 days without a bowel movement.  It is very important that you do not become constipated.  It will make you feel sick to your stomach (nausea) and can cause abdominal pain and vomiting.   Nausea Sheet   Compazine/Prochlorperazine 10mg  tablet. Take 1 tablet every 6 hours as needed for nausea/vomiting. (This can make you sleepy)  If you are having persistent nausea (nausea that does not stop) please call the [facility] and let us know the amount of nausea that you are experiencing.  If you begin to vomit, you need to call the [facility] and if it is the weekend and you have vomited more than one time and can't get it to stop-go to the Emergency Room.  Persistent nausea/vomiting can lead to dehydration (loss of fluid in your body) and will make you feel terrible.   Ice chips, sips of clear liquids, foods that are @ room temperature, crackers, and toast tend to be better tolerated.   SYMPTOMS TO REPORT AS SOON AS POSSIBLE AFTER TREATMENT:   FEVER GREATER THAN 100.5 F  CHILLS WITH OR WITHOUT FEVER  NAUSEA AND VOMITING THAT IS NOT CONTROLLED WITH YOUR NAUSEA MEDICATION  UNUSUAL SHORTNESS OF BREATH  UNUSUAL BRUISING OR BLEEDING  TENDERNESS IN MOUTH AND THROAT WITH OR WITHOUT PRESENCE OF ULCERS  URINARY PROBLEMS  BOWEL PROBLEMS  UNUSUAL RASH      Wear comfortable clothing and clothing appropriate for easy access to any Portacath or PICC line. Let us know if there is anything that we can do to make your therapy better!    What to do if you need assistance after hours or on the weekends: CALL [phone].  HOLD on the line, do not hang up.  You will hear multiple messages but at the end you will be  connected with a nurse triage line.  They will contact the doctor if necessary.  Most of the time they will  be able to assist you.  Do not call the hospital operator.      I have been informed and understand all of the instructions given to me and have received a copy. I have been instructed to call the clinic [phone] or my family physician as soon as possible for continued medical care, if indicated. I do not have any more questions at this time but understand that I may call the [facility] or the Patient Navigator at [phone] during office hours should I have questions or need assistance in obtaining follow-up care.

## 2018-08-22 ENCOUNTER — Encounter (HOSPITAL_COMMUNITY): Payer: Self-pay | Admitting: *Deleted

## 2018-08-22 ENCOUNTER — Encounter (HOSPITAL_COMMUNITY): Payer: Self-pay | Admitting: Hematology

## 2018-08-22 ENCOUNTER — Inpatient Hospital Stay (HOSPITAL_COMMUNITY): Payer: Medicare Other

## 2018-08-22 ENCOUNTER — Ambulatory Visit (HOSPITAL_COMMUNITY): Payer: Medicare Other

## 2018-08-22 NOTE — Progress Notes (Signed)
I received a call from patient's wife this morning and she states that around 530 this morning he went into a semi-conscious state and bent over. She states that he isn't talking and his eyes are rolled into the back of his head. She states that his breathing is shallow and at times almost too hard to recognize.  She wants [place] to refer him to [facility].

## 2018-08-22 NOTE — Progress Notes (Signed)
Faxed pt records to Att: Nancee Liter at [facility] for referral [phone].

## 2018-08-23 ENCOUNTER — Ambulatory Visit (HOSPITAL_COMMUNITY): Payer: Medicare Other

## 2018-08-24 ENCOUNTER — Ambulatory Visit (HOSPITAL_COMMUNITY): Payer: Medicare Other

## 2018-09-12 ENCOUNTER — Ambulatory Visit (HOSPITAL_COMMUNITY): Payer: Medicare Other

## 2018-09-12 ENCOUNTER — Ambulatory Visit (HOSPITAL_COMMUNITY): Payer: Medicare Other | Admitting: Hematology

## 2018-09-12 ENCOUNTER — Other Ambulatory Visit (HOSPITAL_COMMUNITY): Payer: Medicare Other

## 2018-09-13 ENCOUNTER — Ambulatory Visit (HOSPITAL_COMMUNITY): Payer: Medicare Other

## 2018-09-14 DEATH — deceased

## 2018-12-05 ENCOUNTER — Ambulatory Visit: Payer: Medicare Other | Admitting: Internal Medicine

## 2018-12-23 ENCOUNTER — Ambulatory Visit: Payer: Medicare Other | Admitting: Internal Medicine
